# Patient Record
Sex: Female | Born: 1965 | Race: Black or African American | Hispanic: No | Marital: Single | Smoking: Never smoker
Health system: Southern US, Community
[De-identification: ages and names within clinical notes are randomized; demographics above are authoritative.]

## PROBLEM LIST (undated history)

## (undated) DIAGNOSIS — I10 Essential (primary) hypertension: Secondary | ICD-10-CM

## (undated) DIAGNOSIS — E119 Type 2 diabetes mellitus without complications: Secondary | ICD-10-CM

## (undated) HISTORY — DX: Essential (primary) hypertension: I10

---

## 2021-10-08 ENCOUNTER — Other Ambulatory Visit: Payer: Self-pay

## 2021-10-08 ENCOUNTER — Emergency Department (HOSPITAL_COMMUNITY): Payer: Medicaid Other

## 2021-10-08 ENCOUNTER — Emergency Department (HOSPITAL_COMMUNITY)
Admission: EM | Admit: 2021-10-08 | Discharge: 2021-10-08 | Disposition: A | Discharge status: Home / Self Care | Payer: Medicaid Other | Attending: Emergency Medicine | Admitting: Emergency Medicine

## 2021-10-08 DIAGNOSIS — R509 Fever, unspecified: Secondary | ICD-10-CM | POA: Diagnosis not present

## 2021-10-08 DIAGNOSIS — R0789 Other chest pain: Secondary | ICD-10-CM | POA: Insufficient documentation

## 2021-10-08 DIAGNOSIS — R739 Hyperglycemia, unspecified: Secondary | ICD-10-CM | POA: Diagnosis not present

## 2021-10-08 DIAGNOSIS — R630 Anorexia: Secondary | ICD-10-CM | POA: Insufficient documentation

## 2021-10-08 DIAGNOSIS — I10 Essential (primary) hypertension: Secondary | ICD-10-CM

## 2021-10-08 DIAGNOSIS — R1011 Right upper quadrant pain: Secondary | ICD-10-CM | POA: Diagnosis not present

## 2021-10-08 DIAGNOSIS — R519 Headache, unspecified: Secondary | ICD-10-CM

## 2021-10-08 DIAGNOSIS — D696 Thrombocytopenia, unspecified: Secondary | ICD-10-CM | POA: Diagnosis not present

## 2021-10-08 DIAGNOSIS — R1012 Left upper quadrant pain: Secondary | ICD-10-CM | POA: Diagnosis not present

## 2021-10-08 DIAGNOSIS — R079 Chest pain, unspecified: Secondary | ICD-10-CM | POA: Diagnosis present

## 2021-10-08 DIAGNOSIS — R1013 Epigastric pain: Secondary | ICD-10-CM | POA: Diagnosis not present

## 2021-10-08 DIAGNOSIS — Z20822 Contact with and (suspected) exposure to covid-19: Secondary | ICD-10-CM | POA: Diagnosis not present

## 2021-10-08 DIAGNOSIS — K8020 Calculus of gallbladder without cholecystitis without obstruction: Secondary | ICD-10-CM

## 2021-10-08 LAB — COMPREHENSIVE METABOLIC PANEL
ALT: 23 U/L (ref 0–44)
AST: 15 U/L (ref 15–41)
Albumin: 3.5 g/dL (ref 3.5–5.0)
Alkaline Phosphatase: 57 U/L (ref 38–126)
Anion gap: 8 (ref 5–15)
BUN: 9 mg/dL (ref 6–20)
CO2: 26 mmol/L (ref 22–32)
Calcium: 8.9 mg/dL (ref 8.9–10.3)
Chloride: 101 mmol/L (ref 98–111)
Creatinine, Ser: 0.65 mg/dL (ref 0.44–1.00)
GFR, Estimated: >60 mL/min (ref >60)
Glucose, Bld: 289 mg/dL — ABNORMAL HIGH (ref 70–99)
Potassium: 3.7 mmol/L (ref 3.5–5.1)
Sodium: 135 mmol/L (ref 135–145)
Total Bilirubin: 0.5 mg/dL (ref 0.3–1.2)
Total Protein: 7.2 g/dL (ref 6.5–8.1)

## 2021-10-08 LAB — URINALYSIS, ROUTINE W REFLEX MICROSCOPIC
Bacteria, UA: NONE SEEN
Bilirubin Urine: NEGATIVE
Glucose, UA: >=500 mg/dL — AB
Hgb urine dipstick: NEGATIVE
Ketones, ur: NEGATIVE mg/dL
Leukocytes,Ua: NEGATIVE
Nitrite: NEGATIVE
Protein, ur: NEGATIVE mg/dL
Specific Gravity, Urine: 1.012 (ref 1.005–1.030)
pH: 5 (ref 5.0–8.0)

## 2021-10-08 LAB — CBC WITH DIFFERENTIAL/PLATELET
Abs Immature Granulocytes: 0 10*3/uL (ref 0.00–0.07)
Basophils Absolute: 0 10*3/uL (ref 0.0–0.1)
Basophils Relative: 1 %
Eosinophils Absolute: 0.5 10*3/uL (ref 0.0–0.5)
Eosinophils Relative: 12 %
HCT: 40.1 % (ref 36.0–46.0)
Hemoglobin: 13.8 g/dL (ref 12.0–15.0)
Immature Granulocytes: 0 %
Lymphocytes Relative: 53 %
Lymphs Abs: 2.3 10*3/uL (ref 0.7–4.0)
MCH: 31.1 pg (ref 26.0–34.0)
MCHC: 34.4 g/dL (ref 30.0–36.0)
MCV: 90.3 fL (ref 80.0–100.0)
Monocytes Absolute: 0.2 10*3/uL (ref 0.1–1.0)
Monocytes Relative: 6 %
Neutro Abs: 1.2 10*3/uL — ABNORMAL LOW (ref 1.7–7.7)
Neutrophils Relative %: 28 %
Platelets: 56 10*3/uL — ABNORMAL LOW (ref 150–400)
RBC: 4.44 MIL/uL (ref 3.87–5.11)
RDW: 11.9 % (ref 11.5–15.5)
WBC: 4.3 10*3/uL (ref 4.0–10.5)
nRBC: 0 % (ref 0.0–0.2)

## 2021-10-08 LAB — TROPONIN I (HIGH SENSITIVITY)
Troponin I (High Sensitivity): 3 ng/L (ref <18)
Troponin I (High Sensitivity): 3 ng/L (ref <18)

## 2021-10-08 LAB — RESP PANEL BY RT-PCR (FLU A&B, COVID) ARPGX2
Influenza A by PCR: NEGATIVE
Influenza B by PCR: NEGATIVE
SARS Coronavirus 2 by RT PCR: NEGATIVE

## 2021-10-08 LAB — IMMATURE PLATELET FRACTION: Immature Platelet Fraction: 1.6 % (ref 1.2–8.6)

## 2021-10-08 LAB — BRAIN NATRIURETIC PEPTIDE: B Natriuretic Peptide: 11.4 pg/mL (ref 0.0–100.0)

## 2021-10-08 LAB — D-DIMER, QUANTITATIVE: D-Dimer, Quant: 0.59 ug/mL-FEU — ABNORMAL HIGH (ref 0.00–0.50)

## 2021-10-08 LAB — LIPASE, BLOOD: Lipase: 40 U/L (ref 11–51)

## 2021-10-08 MED ORDER — IOHEXOL 350 MG/ML SOLN
100.0000 mL | Freq: Once | INTRAVENOUS | Status: AC | PRN
  Administered 2021-10-08: 75 mL via INTRAVENOUS

## 2021-10-08 MED ORDER — METOCLOPRAMIDE HCL 5 MG/ML IJ SOLN
10.0000 mg | Freq: Once | INTRAMUSCULAR | Status: AC
  Administered 2021-10-08: 10 mg via INTRAVENOUS
  Filled 2021-10-08: qty 2

## 2021-10-08 MED ORDER — LACTATED RINGERS IV BOLUS
1000.0000 mL | Freq: Once | INTRAVENOUS | Status: AC
  Administered 2021-10-08: 1000 mL via INTRAVENOUS

## 2021-10-08 MED ORDER — ACETAMINOPHEN 325 MG PO TABS
650.0000 mg | ORAL_TABLET | Freq: Once | ORAL | Status: AC
  Administered 2021-10-08: 650 mg via ORAL
  Filled 2021-10-08: qty 2

## 2021-10-08 MED ORDER — AMLODIPINE BESYLATE 5 MG PO TABS
5.0000 mg | ORAL_TABLET | Freq: Once | ORAL | Status: AC
Start: 2021-10-08 — End: 2021-10-08
  Administered 2021-10-08: 5 mg via ORAL
  Filled 2021-10-08: qty 1

## 2021-10-08 MED ORDER — LOSARTAN POTASSIUM-HCTZ 100-25 MG PO TABS: 1.0000 | ORAL_TABLET | Freq: Every day | ORAL | 0 refills | Status: DC

## 2021-10-08 NOTE — Discharge Instructions (Addendum)
You can increase your blood pressure medication to two tablets daily.  The new prescription is a higher dose of your current medications to take when you run out of your current prescription.  Follow up with your primary care doctor to follow up on your blood pressure in the next week and your elevated blood sugar. ? ?Your blood tests also showed that your platelet count was low.  Follow up with the hematologist doctor to have that re checked.   ? ?Return to the ED for high fevers, bruising, bleeding or other concerning symptoms ?

## 2021-10-08 NOTE — ED Triage Notes (Signed)
Via EMS from PCP. Pt states CP, headache, loss of appetite x 1 week. Pt on BP meds. GCS 15. Pt reports midsternal cp that radiates "all over" BP at PCP systolic 190s.  ?

## 2021-10-08 NOTE — ED Provider Notes (Signed)
?[facility] ?Provider Note ? ? ?CSN: [id number] ?Arrival date & time: 10/08/21  1316 ? ?  ? ?History ? ?Chief Complaint  ?Patient presents with  ? Chest Pain  ? Migraine  ? ? ?Anna Stephens is a 56 y.o. female. ? ?Patient is a 56 year old female with no known medical problems who takes no prescription medications on a daily basis presenting today from PCPs office with complaints of headache, chest pain and loss of appetite since Monday.  Patient reports on Monday she started having a headache feeling generally bad all over and having chest pain which she describes in the front of her chest.  She reports the pain comes and goes but does not seem to be exacerbated by anything.  She cannot think of anything she can do to make the pain come on and it seems to go away on its own.  She has mild pain at the moment but denies any cough or shortness of breath.  She has had nausea but denies any vomiting or diarrhea.  She is also complaining of some upper abdominal pain.  The headache she reports is all over and feels that the headache was worse when she had a fever last night.  The fever was subjective but she reports she was very hot.  She has no known sick contacts.  She has had no recent travel.  No unilateral leg pain or swelling or prior history of clots.  She has no known heart disease but based on EMS the PCP reported she is supposed to be on blood pressure medication. ? ?The history is provided by the patient. The history is limited by a language barrier. A language interpreter was used Programmer, applications(Swahili).  ?Chest Pain ?Migraine ?Associated symptoms include chest pain.  ? ?  ? ?Home Medications ?Prior to Admission medications   ?Not on File  ?   ? ?Allergies    ?Patient has no known allergies.   ? ?Review of Systems   ?Review of Systems  ?Cardiovascular:  Positive for chest pain.  ? ?Physical Exam ?Updated Vital Signs ?BP (!) 155/96   Pulse 75   Temp 98.2 ?F (36.8 ?C)   Resp 18    SpO2 96%  ?Physical Exam ?Vitals and nursing note reviewed.  ?Constitutional:   ?   General: She is not in acute distress. ?   Appearance: She is well-developed.  ?HENT:  ?   Head: Normocephalic and atraumatic.  ?Eyes:  ?   Pupils: Pupils are equal, round, and reactive to light.  ?Cardiovascular:  ?   Rate and Rhythm: Normal rate and regular rhythm.  ?   Pulses: Normal pulses.  ?   Heart sounds: Normal heart sounds. No murmur heard. ?  No friction rub.  ?Pulmonary:  ?   Effort: Pulmonary effort is normal.  ?   Breath sounds: Normal breath sounds. No wheezing or rales.  ?   Comments: Chest is tender throughout palpation ?Chest:  ?   Chest wall: Tenderness present.  ?Abdominal:  ?   General: Bowel sounds are normal. There is no distension.  ?   Palpations: Abdomen is soft.  ?   Tenderness: There is abdominal tenderness in the right upper quadrant, epigastric area and left upper quadrant. There is no guarding or rebound.  ?Musculoskeletal:     ?   General: No tenderness. Normal range of motion.  ?   Cervical back: Normal range of motion and neck supple. No tenderness.  ?  Right lower leg: No edema.  ?   Left lower leg: No edema.  ?   Comments: No edema  ?Skin: ?   General: Skin is warm and dry.  ?   Findings: No rash.  ?Neurological:  ?   Mental Status: She is alert and oriented to person, place, and time. Mental status is at baseline.  ?   Cranial Nerves: No cranial nerve deficit.  ?Psychiatric:     ?   Mood and Affect: Mood normal.     ?   Behavior: Behavior normal.  ? ? ?ED Results / Procedures / Treatments   ?Labs ?(all labs ordered are listed, but only abnormal results are displayed) ?Labs Reviewed  ?CBC WITH DIFFERENTIAL/PLATELET - Abnormal; Notable for the following components:  ?    Result Value  ? Platelets 56 (*)   ? Neutro Abs 1.2 (*)   ? All other components within normal limits  ?COMPREHENSIVE METABOLIC PANEL - Abnormal; Notable for the following components:  ? Glucose, Bld 289 (*)   ? All other  components within normal limits  ?D-DIMER, QUANTITATIVE - Abnormal; Notable for the following components:  ? D-Dimer, Quant 0.59 (*)   ? All other components within normal limits  ?RESP PANEL BY RT-PCR (FLU A&B, COVID) [id number]  ?LIPASE, BLOOD  ?URINALYSIS, ROUTINE W REFLEX MICROSCOPIC  ?BRAIN NATRIURETIC PEPTIDE  ?TROPONIN I (HIGH SENSITIVITY)  ?TROPONIN I (HIGH SENSITIVITY)  ? ? ?EKG ?EKG Interpretation ? ?Date/Time:  Wednesday October 08 2021 13:24:18 EDT ?Ventricular Rate:  74 ?PR Interval:  189 ?QRS Duration: 90 ?QT Interval:  389 ?QTC Calculation: 432 ?R Axis:   86 ?Text Interpretation: Sinus rhythm Minimal ST elevation, lateral leads No previous tracing Confirmed by Gwyneth Sprout ([id number]) on 10/08/2021 2:03:36 PM ? ?Radiology ?DG Chest Port 1 View ? ?Result Date: 10/08/2021 ?CLINICAL DATA:  Chest pain EXAM: PORTABLE CHEST 1 VIEW COMPARISON:  None. FINDINGS: Mild bilateral interstitial thickening. No focal consolidation. No pleural effusion or pneumothorax. Stable cardiomegaly. No acute osseous abnormality. IMPRESSION: 1. Cardiomegaly with mild pulmonary vascular congestion. Electronically Signed   By: Elige Ko M.D.   On: 10/08/2021 14:12   ? ?Procedures ?Procedures  ? ? ?Medications Ordered in ED ?Medications  ?lactated ringers bolus 1,000 mL (0 mLs Intravenous Stopped 10/08/21 1438)  ?metoCLOPramide (REGLAN) injection 10 mg (10 mg Intravenous Given 10/08/21 1428)  ?acetaminophen (TYLENOL) tablet 650 mg (650 mg Oral Given 10/08/21 1428)  ? ? ?ED Course/ Medical Decision Making/ A&P ?  ?                        ?Medical Decision Making ?Amount and/or Complexity of Data Reviewed ?External Data Reviewed: notes. ?Labs: ordered. Decision-making details documented in ED Course. ?Radiology: ordered and independent interpretation performed. Decision-making details documented in ED Course. ?ECG/medicine tests: ordered and independent interpretation performed. Decision-making details documented in ED Course. ? ?Risk ?OTC  drugs. ?Prescription drug management. ? ? ?Patient is a 56 year old female with possible history of hypertension even though patient denies with nonspecific chest pain today which has been present intermittently since Monday but seems to be pleuritic in nature.  She denies any cough or vomiting but has had nausea.  Patient was seen by her PCP today and sent here for further care.  Patient also reports subjective fever last night.  Patient is well-appearing on exam but is complaining of a headache.  No findings suggestive of meningitis.  Patient has normal mental status.  No  findings to suggest stroke.  Concern for ACS versus pneumonia versus PE versus viral etiology with general malaise.  Patient given a headache cocktail, Tylenol and IV fluids.  We will continue to follow blood pressure here as it is elevated at 195/96.  I independently interpreted patient's EKG today and it is within normal limits today and lower suspicion at this time for hypertensive emergency.  Low risk wells criteria so d-dimer sent and HEART score of 2. ?I independently interpreted patient's labs today she has a negative COVID and flu, normal troponin, CMP with blood sugar of 289, otherwise normal CMP, normal CBC except for a platelet count of 56, D-dimer is mildly elevated at 0.59 lipase is within normal limits.  Patient will need a delta troponin and BNP is still pending.  I independently interpreted patient's chest x-ray it appears to be a poor expiratory film but radiology notes some cardiomegaly and possibly pulmonary congestion.  CTA is pending.  Patient checked out to Dr. Lynelle Doctor.  Blood pressure is improving after having headache cocktail. ? ? ? ? ? ? ? ? ? ?Final Clinical Impression(s) / ED Diagnoses ?Final diagnoses:  ?None  ? ? ?Rx / DC Orders ?ED Discharge Orders   ? ? None  ? ?  ? ? ?  ?Gwyneth Sprout, MD ?10/08/21 1544 ? ?

## 2021-10-08 NOTE — ED Notes (Signed)
Got patient undressed on the monitor did ekg shown to Dr Anitra Lauth patient is resting with family at bedside  ?

## 2021-10-08 NOTE — ED Notes (Signed)
Back from CT

## 2021-10-08 NOTE — ED Notes (Signed)
ED Provider at bedside. 

## 2021-10-08 NOTE — ED Provider Notes (Signed)
Patient was initially seen by Dr. Maryan Rued.  Please see her note. ? ?Clinical Course as of 10/09/21 1550  ?Wed Oct 08, 2021  ?1704 Troponin I (High Sensitivity) ?Serial troponins normal [JK]  ?1704 Lipase, blood ?Normal [JK]  ?1704 CBC with Differential/Platelet(!) ?Thrombocytopenia noted [JK]  ?1705 Urinalysis, Routine w reflex microscopic Urine, Clean Catch(!) ?Normal [JK]  ?1706 CT Angio Chest PE W and/or Wo Contrast ?Chest CT findings reviewed.  No PE.  Possible hepatic steatosis and gallstones [JK]  ?1729 Call placed to hematology oncology.   [JK]  ?1800 Discussed with Dr Lorenso Courier.  Hepatomegally noted on CT could be related.  Pt can follow up as an outpatient with heme onc [JK]  ?  ?Clinical Course User Index ?[JK] Dorie Rank, MD  ? ?Patient's labs are notable for thrombocytopenia.  Patient has evidence of hyperglycemia but no signs of acidosis.  Chest CT does not show any PE.  Patient does have incidental hepatic steatosis and gallstones but her symptoms are not suggestive of biliary colic or cholecystitis.  No signs of acute cardiac ischemia.  No signs of CHF. ? ?Since blood pressure does remain elevated.  She takes losartan 50 mg/HCTZ.  We will have the patient increase her dosing. ? ?Thrombocytopenia also noted.  Unclear etiology although no findings to suggest TTP.  Will have pt follow up with hematology oncology ?  ?Dorie Rank, MD ?10/09/21 1550 ? ?

## 2021-10-08 NOTE — ED Notes (Signed)
Patient transported to CT 

## 2021-10-09 ENCOUNTER — Other Ambulatory Visit: Payer: Self-pay | Admitting: Physician Assistant

## 2021-10-09 DIAGNOSIS — Z1231 Encounter for screening mammogram for malignant neoplasm of breast: Secondary | ICD-10-CM

## 2021-10-10 ENCOUNTER — Telehealth: Payer: Self-pay | Admitting: Hematology and Oncology

## 2021-10-10 NOTE — Telephone Encounter (Signed)
Scheduled appt per 4/27 referral. Used interpreter services. Pt is aware of appt date and time. Pt is aware to arrive 15 mins prior to appt time and to bring and updated insurance card. Pt is aware of appt location.   ?

## 2021-10-16 ENCOUNTER — Inpatient Hospital Stay: Payer: Medicaid Other | Attending: Hematology and Oncology | Admitting: Hematology and Oncology

## 2021-10-16 ENCOUNTER — Inpatient Hospital Stay: Payer: Medicaid Other

## 2022-01-16 ENCOUNTER — Encounter (HOSPITAL_COMMUNITY): Payer: Self-pay | Admitting: *Deleted

## 2022-01-16 ENCOUNTER — Other Ambulatory Visit: Payer: Self-pay

## 2022-01-16 ENCOUNTER — Ambulatory Visit (HOSPITAL_COMMUNITY)
Admission: EM | Admit: 2022-01-16 | Discharge: 2022-01-16 | Disposition: A | Discharge status: Home / Self Care | Payer: Medicaid Other | Attending: Urgent Care | Admitting: Urgent Care

## 2022-01-16 DIAGNOSIS — R7309 Other abnormal glucose: Secondary | ICD-10-CM | POA: Diagnosis present

## 2022-01-16 DIAGNOSIS — D691 Qualitative platelet defects: Secondary | ICD-10-CM | POA: Diagnosis not present

## 2022-01-16 DIAGNOSIS — I10 Essential (primary) hypertension: Secondary | ICD-10-CM | POA: Diagnosis not present

## 2022-01-16 DIAGNOSIS — Z760 Encounter for issue of repeat prescription: Secondary | ICD-10-CM | POA: Diagnosis present

## 2022-01-16 DIAGNOSIS — E1165 Type 2 diabetes mellitus with hyperglycemia: Secondary | ICD-10-CM | POA: Diagnosis not present

## 2022-01-16 LAB — CBC WITH DIFFERENTIAL/PLATELET
Abs Immature Granulocytes: 0.01 10*3/uL (ref 0.00–0.07)
Basophils Absolute: 0.1 10*3/uL (ref 0.0–0.1)
Basophils Relative: 1 %
Eosinophils Absolute: 1.4 10*3/uL — ABNORMAL HIGH (ref 0.0–0.5)
Eosinophils Relative: 25 %
HCT: 42.4 % (ref 36.0–46.0)
Hemoglobin: 14.8 g/dL (ref 12.0–15.0)
Immature Granulocytes: 0 %
Lymphocytes Relative: 38 %
Lymphs Abs: 2 10*3/uL (ref 0.7–4.0)
MCH: 30.3 pg (ref 26.0–34.0)
MCHC: 34.9 g/dL (ref 30.0–36.0)
MCV: 86.7 fL (ref 80.0–100.0)
Monocytes Absolute: 0.2 10*3/uL (ref 0.1–1.0)
Monocytes Relative: 5 %
Neutro Abs: 1.7 10*3/uL (ref 1.7–7.7)
Neutrophils Relative %: 31 %
Platelets: 215 10*3/uL (ref 150–400)
RBC: 4.89 MIL/uL (ref 3.87–5.11)
RDW: 12.9 % (ref 11.5–15.5)
WBC: 5.4 10*3/uL (ref 4.0–10.5)
nRBC: 0 % (ref 0.0–0.2)

## 2022-01-16 LAB — CBG MONITORING, ED: Glucose-Capillary: 497 mg/dL — ABNORMAL HIGH (ref 70–99)

## 2022-01-16 MED ORDER — METFORMIN HCL 500 MG PO TABS: 500.0000 mg | ORAL_TABLET | Freq: Two times a day (BID) | ORAL | 0 refills | Status: DC

## 2022-01-16 MED ORDER — LOSARTAN POTASSIUM-HCTZ 100-25 MG PO TABS: 1.0000 | ORAL_TABLET | Freq: Every day | ORAL | 0 refills | Status: DC

## 2022-01-16 NOTE — ED Provider Notes (Signed)
[facility]    CSN: [id number] Arrival date & time: 01/16/22  1234      History   Chief Complaint Chief Complaint  Patient presents with   Medication Refill    HPI Anna Stephens is a 56 y.o. female.   Pleasant 56 year old female presents today primarily requesting blood pressure medication refill.  She was seen in the emergency room back in April due to a headache, had an extensive work-up, and was discharged home on losartan hydrochlorothiazide.  Patient states she has been taking it as prescribed and feels that the blood pressure medication is working.  Normalization of her blood pressure did stop her headaches.  She denies any side effects to the medication.  She unfortunately ran out of bed ago, and has not been on recently.  Of note, her glucose was close to 300 and platelets were 56 in the emergency room.  She has not yet had them rechecked.  She denies easy bruising, abnormal bleeding, or abdominal pain.  She denies blurred vision or dizziness.  She denies polyuria.  She has no acute complaints today.  She does not have a PCP.   Medication Refill   History reviewed. No pertinent past medical history.  There are no problems to display for this patient.   History reviewed. No pertinent surgical history.  OB History   No obstetric history on file.      Home Medications    Prior to Admission medications   Medication Sig Start Date End Date Taking? Authorizing Provider  losartan-hydrochlorothiazide (HYZAAR) 100-25 MG tablet Take 1 tablet by mouth daily. 01/16/22   Maretta Bees, PA    Family History History reviewed. No pertinent family history.  Social History Social History   Tobacco Use   Smoking status: Never   Smokeless tobacco: Never     Allergies   Patient has no known allergies.   Review of Systems Review of Systems  All other systems reviewed and are negative.    Physical Exam Triage Vital Signs ED Triage Vitals  Enc Vitals  Group     BP 01/16/22 1309 (!) 141/86     Pulse Rate 01/16/22 1309 62     Resp 01/16/22 1309 18     Temp 01/16/22 1309 98.6 F (37 C)     Temp src --      SpO2 01/16/22 1309 98 %     Weight --      Height --      Head Circumference --      Peak Flow --      Pain Score 01/16/22 1307 0     Pain Loc --      Pain Edu? --      Excl. in GC? --    No data found.  Updated Vital Signs BP (!) 141/86   Pulse 62   Temp 98.6 F (37 C)   Resp 18   SpO2 98%   Visual Acuity Right Eye Distance:   Left Eye Distance:   Bilateral Distance:    Right Eye Near:   Left Eye Near:    Bilateral Near:     Physical Exam Vitals and nursing note reviewed.  Constitutional:      General: She is not in acute distress.    Appearance: Normal appearance. She is obese. She is not ill-appearing, toxic-appearing or diaphoretic.  HENT:     Head: Normocephalic and atraumatic.     Right Ear: External ear normal.  Left Ear: External ear normal.  Cardiovascular:     Rate and Rhythm: Normal rate and regular rhythm.     Pulses: Normal pulses.  Pulmonary:     Effort: Pulmonary effort is normal. No respiratory distress.     Breath sounds: Normal breath sounds. No stridor.  Musculoskeletal:        General: Normal range of motion.     Cervical back: Normal range of motion.  Skin:    General: Skin is warm.     Findings: No erythema or rash.  Neurological:     Mental Status: She is alert and oriented to person, place, and time.      UC Treatments / Results  Labs (all labs ordered are listed, but only abnormal results are displayed) Labs Reviewed  CBG MONITORING, ED - Abnormal; Notable for the following components:      Result Value   Glucose-Capillary 497 (*)    All other components within normal limits  CBC WITH DIFFERENTIAL/PLATELET    EKG   Radiology No results found.  Procedures Procedures (including critical care time)  Medications Ordered in UC Medications - No data to  display  Initial Impression / Assessment and Plan / UC Course  I have reviewed the triage vital signs and the nursing notes.  Pertinent labs & imaging results that were available during my care of the patient were reviewed by me and considered in my medical decision making (see chart for details).     Medication refill/ HTN - BP appears slightly elevated today, however ran out of medication a few weeks ago. Per pt, BP was normal while on medications. Tolerating well.  Diabetes mellitus - glucose was nearly 300 in April in ER, nearly 500 here today. Pt without sx of DKA or HHS, I suspect this to be her baseline. She denies headache, fatigue, polyuria, polydipsia. Will discharge her home on medication for DM (last creatinine 0.65) and schedule pt for follow up with PCP on 01/22/22 Abnormal platelets - 56 in ER. Recheck today. Pt does not show evidence of bruising or bleeding.  Final Clinical Impressions(s) / UC Diagnoses   Final diagnoses:  Medication refill  Primary hypertension  Abnormal platelets (HCC)  Other abnormal glucose     Discharge Instructions      Your glucose is almost 500. You have Diabetes mellitus. Please start taking medication as prescribed today for your blood sugar. You will need to follow up with a PCP in 2 weeks for recheck. I have also refilled your blood pressure medication. We will call with the results of your labs as indicated.      ED Prescriptions     Medication Sig Dispense Auth. Provider   losartan-hydrochlorothiazide (HYZAAR) 100-25 MG tablet Take 1 tablet by mouth daily. 30 tablet Laberta Wilbon L, [place]      PDMP not reviewed this encounter.   Maretta Bees, [place] 01/16/22 1404

## 2022-01-16 NOTE — ED Triage Notes (Addendum)
PT reports she needs a refill on BP med but not know the name of med. Last does 3 weeks ago.

## 2022-01-16 NOTE — Discharge Instructions (Addendum)
Your glucose is almost 500. You have Diabetes mellitus. Please start taking medication as prescribed today for your blood sugar. Take twice daily with food. You will need to follow up with a PCP with in 1 week for recheck. We made you an appointment with Glenetta Hew on 01/22/22 for a new patient evaluation. I have also refilled your blood pressure medication. Take this one pill every morning. We will call with the results of your labs as indicated.

## 2022-01-22 ENCOUNTER — Ambulatory Visit: Payer: Medicaid Other | Admitting: Internal Medicine

## 2022-01-28 ENCOUNTER — Ambulatory Visit (INDEPENDENT_AMBULATORY_CARE_PROVIDER_SITE_OTHER): Payer: Medicaid Other | Admitting: Nurse Practitioner

## 2022-01-28 ENCOUNTER — Encounter: Payer: Self-pay | Admitting: Nurse Practitioner

## 2022-01-28 VITALS — BP 164/111 | HR 84 | Temp 97.3°F | Ht 60.0 in | Wt 217.8 lb

## 2022-01-28 DIAGNOSIS — Z0000 Encounter for general adult medical examination without abnormal findings: Secondary | ICD-10-CM

## 2022-01-28 DIAGNOSIS — E119 Type 2 diabetes mellitus without complications: Secondary | ICD-10-CM | POA: Diagnosis not present

## 2022-01-28 LAB — POCT URINALYSIS DIP (CLINITEK)
Bilirubin, UA: NEGATIVE
Blood, UA: NEGATIVE
Glucose, UA: 500 mg/dL — AB
Ketones, POC UA: NEGATIVE mg/dL
Leukocytes, UA: NEGATIVE
Nitrite, UA: NEGATIVE
POC PROTEIN,UA: NEGATIVE
Spec Grav, UA: <=1.005 — AB (ref 1.010–1.025)
Urobilinogen, UA: 0.2 U/dL
pH, UA: 6.5 (ref 5.0–8.0)

## 2022-01-28 MED ORDER — METFORMIN HCL 500 MG PO TABS: 500.0000 mg | ORAL_TABLET | Freq: Two times a day (BID) | ORAL | 0 refills | Status: DC

## 2022-01-28 MED ORDER — LOSARTAN POTASSIUM-HCTZ 100-25 MG PO TABS: 1.0000 | ORAL_TABLET | Freq: Every day | ORAL | 0 refills | Status: DC

## 2022-01-28 NOTE — Patient Instructions (Signed)
1. Health care maintenance  - POCT glycosylated hemoglobin (Hb A1C) - POCT URINALYSIS DIP (CLINITEK) - losartan-hydrochlorothiazide (HYZAAR) 100-25 MG tablet; Take 1 tablet by mouth daily.  Dispense: 30 tablet; Refill: 0 - metFORMIN (GLUCOPHAGE) 500 MG tablet; Take 1 tablet (500 mg total) by mouth 2 (two) times daily with a meal.  Dispense: 30 tablet; Refill: 0 - CBC - Comprehensive metabolic panel - Hemoglobin A1c  2. Type 2 diabetes mellitus without complication, without long-term current use of insulin (HCC)  - Hemoglobin A1c   Follow up:  Follow up in 3 months or sooner if needed

## 2022-01-28 NOTE — Progress Notes (Signed)
@Patient  ID: , female    DOB: [birth year], 56 y.o.   MRN: [id number]  Chief Complaint  Patient presents with   Establish Care    Pt is here establish care.    Referring provider: No ref. provider found   HPI  56 year old with history of hypertension and diabetes    Patient is not taking medications. She was prescribed diabetic medication and hypertension meds through the ED. She never picked up these medications from the pharmacy. We will send these medications back to the pharmacy again today. Blood pressure is elevated in office. She does have case worker and interpreter with her toady. Denies f/c/s, n/v/d, hemoptysis, PND, leg swelling Denies chest pain or edema    No Known Allergies   There is no immunization history on file for this patient.  Past Medical History:  Diagnosis Date   Hypertension     Tobacco History: Social History   Tobacco Use  Smoking Status Never  Smokeless Tobacco Never   Counseling given: Not Answered   Outpatient Encounter Medications as of 01/28/2022  Medication Sig   losartan-hydrochlorothiazide (HYZAAR) 100-25 MG tablet Take 1 tablet by mouth daily.   metFORMIN (GLUCOPHAGE) 500 MG tablet Take 1 tablet (500 mg total) by mouth 2 (two) times daily with a meal.   [DISCONTINUED] losartan-hydrochlorothiazide (HYZAAR) 100-25 MG tablet Take 1 tablet by mouth daily. (Patient not taking: Reported on 01/28/2022)   [DISCONTINUED] metFORMIN (GLUCOPHAGE) 500 MG tablet Take 1 tablet (500 mg total) by mouth 2 (two) times daily with a meal. (Patient not taking: Reported on 01/28/2022)   No facility-administered encounter medications on file as of 01/28/2022.     Review of Systems  Review of Systems  Constitutional: Negative.   HENT: Negative.    Cardiovascular: Negative.   Gastrointestinal: Negative.   Allergic/Immunologic: Negative.   Neurological: Negative.   Psychiatric/Behavioral: Negative.         Physical Exam  BP (!)  164/111 (BP Location: Right Arm, Patient Position: Sitting, Cuff Size: Large)   Pulse 84   Temp (!) 97.3 F (36.3 C)   Ht 5' (1.524 m)   Wt 217 lb 12.8 oz (98.8 kg)   SpO2 98%   BMI 42.54 kg/m   Wt Readings from Last 5 Encounters:  01/28/22 217 lb 12.8 oz (98.8 kg)     Physical Exam Vitals and nursing note reviewed.  Constitutional:      General: She is not in acute distress.    Appearance: She is well-developed.  Cardiovascular:     Rate and Rhythm: Normal rate and regular rhythm.  Pulmonary:     Effort: Pulmonary effort is normal.     Breath sounds: Normal breath sounds.  Neurological:     Mental Status: She is alert and oriented to person, place, and time.      Lab Results:  CBC    Component Value Date/Time   WBC 5.4 01/16/2022 1336   RBC 4.89 01/16/2022 1336   HGB 14.8 01/16/2022 1336   HCT 42.4 01/16/2022 1336   PLT 215 01/16/2022 1336   MCV 86.7 01/16/2022 1336   MCH 30.3 01/16/2022 1336   MCHC 34.9 01/16/2022 1336   RDW 12.9 01/16/2022 1336   LYMPHSABS 2.0 01/16/2022 1336   MONOABS 0.2 01/16/2022 1336   EOSABS 1.4 (H) 01/16/2022 1336   BASOSABS 0.1 01/16/2022 1336    BMET    Component Value Date/Time   NA 135 10/08/2021 1357   K 3.7 10/08/2021 1357  CL 101 10/08/2021 1357   CO2 26 10/08/2021 1357   GLUCOSE 289 (H) 10/08/2021 1357   BUN 9 10/08/2021 1357   CREATININE 0.65 10/08/2021 1357   CALCIUM 8.9 10/08/2021 1357   GFRNONAA >60 10/08/2021 1357    BNP    Component Value Date/Time   BNP 11.4 10/08/2021 1357      Assessment & Plan:   Health care maintenance - POCT glycosylated hemoglobin (Hb A1C) - POCT URINALYSIS DIP (CLINITEK) - losartan-hydrochlorothiazide (HYZAAR) 100-25 MG tablet; Take 1 tablet by mouth daily.  Dispense: 30 tablet; Refill: 0 - metFORMIN (GLUCOPHAGE) 500 MG tablet; Take 1 tablet (500 mg total) by mouth 2 (two) times daily with a meal.  Dispense: 30 tablet; Refill: 0 - CBC - Comprehensive metabolic panel -  Hemoglobin A1c  2. Type 2 diabetes mellitus without complication, without long-term current use of insulin (HCC)  - Hemoglobin A1c   Follow up:  Follow up in 3 months or sooner if needed     Ivonne Andrew, NP 01/28/2022

## 2022-01-28 NOTE — Assessment & Plan Note (Signed)
-   POCT glycosylated hemoglobin (Hb A1C) - POCT URINALYSIS DIP (CLINITEK) - losartan-hydrochlorothiazide (HYZAAR) 100-25 MG tablet; Take 1 tablet by mouth daily.  Dispense: 30 tablet; Refill: 0 - metFORMIN (GLUCOPHAGE) 500 MG tablet; Take 1 tablet (500 mg total) by mouth 2 (two) times daily with a meal.  Dispense: 30 tablet; Refill: 0 - CBC - Comprehensive metabolic panel - Hemoglobin A1c  2. Type 2 diabetes mellitus without complication, without long-term current use of insulin (HCC)  - Hemoglobin A1c   Follow up:  Follow up in 3 months or sooner if needed

## 2022-01-29 ENCOUNTER — Telehealth: Payer: Self-pay | Admitting: Nurse Practitioner

## 2022-01-29 NOTE — Telephone Encounter (Signed)
Patient notified that Blood glucose was critical high and that she needs to go to the ED for further evaluation. Patient agrees. Family will transport.

## 2022-01-30 ENCOUNTER — Telehealth: Payer: Self-pay

## 2022-01-30 ENCOUNTER — Telehealth: Payer: Self-pay | Admitting: Nurse Practitioner

## 2022-01-30 ENCOUNTER — Inpatient Hospital Stay (HOSPITAL_COMMUNITY)
Admission: EM | Admit: 2022-01-30 | Discharge: 2022-02-02 | DRG: 638 | Disposition: A | Discharge status: Home / Self Care | Payer: Medicaid Other | Attending: Internal Medicine | Admitting: Internal Medicine

## 2022-01-30 ENCOUNTER — Encounter (HOSPITAL_COMMUNITY): Payer: Self-pay

## 2022-01-30 DIAGNOSIS — Z23 Encounter for immunization: Secondary | ICD-10-CM | POA: Diagnosis not present

## 2022-01-30 DIAGNOSIS — E860 Dehydration: Secondary | ICD-10-CM | POA: Diagnosis present

## 2022-01-30 DIAGNOSIS — Z79899 Other long term (current) drug therapy: Secondary | ICD-10-CM

## 2022-01-30 DIAGNOSIS — E1165 Type 2 diabetes mellitus with hyperglycemia: Secondary | ICD-10-CM | POA: Diagnosis present

## 2022-01-30 DIAGNOSIS — Z6841 Body Mass Index (BMI) 40.0 and over, adult: Secondary | ICD-10-CM | POA: Diagnosis not present

## 2022-01-30 DIAGNOSIS — E119 Type 2 diabetes mellitus without complications: Secondary | ICD-10-CM

## 2022-01-30 DIAGNOSIS — R739 Hyperglycemia, unspecified: Principal | ICD-10-CM

## 2022-01-30 DIAGNOSIS — Z603 Acculturation difficulty: Secondary | ICD-10-CM | POA: Diagnosis present

## 2022-01-30 DIAGNOSIS — E1100 Type 2 diabetes mellitus with hyperosmolarity without nonketotic hyperglycemic-hyperosmolar coma (NKHHC): Secondary | ICD-10-CM | POA: Diagnosis present

## 2022-01-30 DIAGNOSIS — I10 Essential (primary) hypertension: Secondary | ICD-10-CM | POA: Diagnosis present

## 2022-01-30 DIAGNOSIS — E876 Hypokalemia: Secondary | ICD-10-CM | POA: Diagnosis present

## 2022-01-30 LAB — BASIC METABOLIC PANEL
Anion gap: 12 (ref 5–15)
Anion gap: 9 (ref 5–15)
Anion gap: 9 (ref 5–15)
BUN: 15 mg/dL (ref 6–20)
BUN: 14 mg/dL (ref 6–20)
BUN: 10 mg/dL (ref 6–20)
CO2: 26 mmol/L (ref 22–32)
CO2: 28 mmol/L (ref 22–32)
CO2: 30 mmol/L (ref 22–32)
Calcium: 9.5 mg/dL (ref 8.9–10.3)
Calcium: 9.2 mg/dL (ref 8.9–10.3)
Calcium: 9.1 mg/dL (ref 8.9–10.3)
Chloride: 88 mmol/L — ABNORMAL LOW (ref 98–111)
Chloride: 96 mmol/L — ABNORMAL LOW (ref 98–111)
Chloride: 103 mmol/L (ref 98–111)
Creatinine, Ser: 0.91 mg/dL (ref 0.44–1.00)
Creatinine, Ser: 0.7 mg/dL (ref 0.44–1.00)
Creatinine, Ser: 0.62 mg/dL (ref 0.44–1.00)
GFR, Estimated: >60 mL/min (ref >60)
GFR, Estimated: >60 mL/min (ref >60)
GFR, Estimated: >60 mL/min (ref >60)
Glucose, Bld: 854 mg/dL (ref 70–99)
Glucose, Bld: 577 mg/dL (ref 70–99)
Glucose, Bld: 179 mg/dL — ABNORMAL HIGH (ref 70–99)
Potassium: 3.3 mmol/L — ABNORMAL LOW (ref 3.5–5.1)
Potassium: 3.4 mmol/L — ABNORMAL LOW (ref 3.5–5.1)
Potassium: 3 mmol/L — ABNORMAL LOW (ref 3.5–5.1)
Sodium: 126 mmol/L — ABNORMAL LOW (ref 135–145)
Sodium: 133 mmol/L — ABNORMAL LOW (ref 135–145)
Sodium: 142 mmol/L (ref 135–145)

## 2022-01-30 LAB — COMPREHENSIVE METABOLIC PANEL
ALT: 19 IU/L (ref 0–32)
AST: 15 IU/L (ref 0–40)
Albumin/Globulin Ratio: 1.2 (ref 1.2–2.2)
Albumin: 4.3 g/dL (ref 3.8–4.9)
Alkaline Phosphatase: 101 IU/L (ref 44–121)
BUN/Creatinine Ratio: 8 — ABNORMAL LOW (ref 9–23)
BUN: 7 mg/dL (ref 6–24)
Bilirubin Total: 0.5 mg/dL (ref 0.0–1.2)
CO2: 22 mmol/L (ref 20–29)
Calcium: 9.3 mg/dL (ref 8.7–10.2)
Chloride: 95 mmol/L — ABNORMAL LOW (ref 96–106)
Creatinine, Ser: 0.83 mg/dL (ref 0.57–1.00)
Globulin, Total: 3.6 g/dL (ref 1.5–4.5)
Glucose: 584 mg/dL (ref 70–99)
Potassium: 3.8 mmol/L (ref 3.5–5.2)
Sodium: 136 mmol/L (ref 134–144)
Total Protein: 7.9 g/dL (ref 6.0–8.5)
eGFR: 83 mL/min/{1.73_m2} (ref >59)

## 2022-01-30 LAB — CBC WITH DIFFERENTIAL/PLATELET
Abs Immature Granulocytes: 0 10*3/uL (ref 0.00–0.07)
Basophils Absolute: 0 10*3/uL (ref 0.0–0.1)
Basophils Relative: 1 %
Eosinophils Absolute: 1.4 10*3/uL — ABNORMAL HIGH (ref 0.0–0.5)
Eosinophils Relative: 25 %
HCT: 46.3 % — ABNORMAL HIGH (ref 36.0–46.0)
Hemoglobin: 15.8 g/dL — ABNORMAL HIGH (ref 12.0–15.0)
Immature Granulocytes: 0 %
Lymphocytes Relative: 32 %
Lymphs Abs: 1.8 10*3/uL (ref 0.7–4.0)
MCH: 30.4 pg (ref 26.0–34.0)
MCHC: 34.1 g/dL (ref 30.0–36.0)
MCV: 89.2 fL (ref 80.0–100.0)
Monocytes Absolute: 0.3 10*3/uL (ref 0.1–1.0)
Monocytes Relative: 4 %
Neutro Abs: 2.1 10*3/uL (ref 1.7–7.7)
Neutrophils Relative %: 38 %
Platelets: 198 10*3/uL (ref 150–400)
RBC: 5.19 MIL/uL — ABNORMAL HIGH (ref 3.87–5.11)
RDW: 12.7 % (ref 11.5–15.5)
WBC: 5.6 10*3/uL (ref 4.0–10.5)
nRBC: 0 % (ref 0.0–0.2)

## 2022-01-30 LAB — CBC
Hematocrit: 46.5 % (ref 34.0–46.6)
Hemoglobin: 15.4 g/dL (ref 11.1–15.9)
MCH: 30 pg (ref 26.6–33.0)
MCHC: 33.1 g/dL (ref 31.5–35.7)
MCV: 91 fL (ref 79–97)
Platelets: 212 10*3/uL (ref 150–450)
RBC: 5.14 x10E6/uL (ref 3.77–5.28)
RDW: 13.3 % (ref 11.7–15.4)
WBC: 6.3 10*3/uL (ref 3.4–10.8)

## 2022-01-30 LAB — CBG MONITORING, ED
Glucose-Capillary: 166 mg/dL — ABNORMAL HIGH (ref 70–99)
Glucose-Capillary: 191 mg/dL — ABNORMAL HIGH (ref 70–99)
Glucose-Capillary: 197 mg/dL — ABNORMAL HIGH (ref 70–99)
Glucose-Capillary: 222 mg/dL — ABNORMAL HIGH (ref 70–99)
Glucose-Capillary: 239 mg/dL — ABNORMAL HIGH (ref 70–99)
Glucose-Capillary: 350 mg/dL — ABNORMAL HIGH (ref 70–99)
Glucose-Capillary: 438 mg/dL — ABNORMAL HIGH (ref 70–99)
Glucose-Capillary: 565 mg/dL (ref 70–99)
Glucose-Capillary: >600 mg/dL (ref 70–99)

## 2022-01-30 LAB — URINALYSIS, ROUTINE W REFLEX MICROSCOPIC
Bacteria, UA: NONE SEEN
Bilirubin Urine: NEGATIVE
Glucose, UA: >=500 mg/dL — AB
Hgb urine dipstick: NEGATIVE
Ketones, ur: NEGATIVE mg/dL
Leukocytes,Ua: NEGATIVE
Nitrite: NEGATIVE
Protein, ur: NEGATIVE mg/dL
Specific Gravity, Urine: 1.023 (ref 1.005–1.030)
pH: 6 (ref 5.0–8.0)

## 2022-01-30 LAB — BLOOD GAS, VENOUS
Acid-Base Excess: 8 mmol/L — ABNORMAL HIGH (ref 0.0–2.0)
Bicarbonate: 33.9 mmol/L — ABNORMAL HIGH (ref 20.0–28.0)
O2 Saturation: 49.8 %
Patient temperature: 37
pCO2, Ven: 51 mmHg (ref 44–60)
pH, Ven: 7.43 (ref 7.25–7.43)
pO2, Ven: 33 mmHg (ref 32–45)

## 2022-01-30 LAB — HEMOGLOBIN A1C
Est. average glucose Bld gHb Est-mCnc: >398 mg/dL
Hgb A1c MFr Bld: >15.5 % — ABNORMAL HIGH (ref 4.8–5.6)

## 2022-01-30 LAB — MAGNESIUM: Magnesium: 2 mg/dL (ref 1.7–2.4)

## 2022-01-30 LAB — BETA-HYDROXYBUTYRIC ACID: Beta-Hydroxybutyric Acid: 0.19 mmol/L (ref 0.05–0.27)

## 2022-01-30 MED ORDER — INSULIN ASPART 100 UNIT/ML IJ SOLN
0.0000 [IU] | Freq: Three times a day (TID) | INTRAMUSCULAR | Status: DC
  Administered 2022-01-31: 7 [IU] via SUBCUTANEOUS
  Administered 2022-01-31: 5 [IU] via SUBCUTANEOUS
  Administered 2022-01-31: 3 [IU] via SUBCUTANEOUS
  Administered 2022-02-01: 5 [IU] via SUBCUTANEOUS
  Filled 2022-01-30: qty 0.09

## 2022-01-30 MED ORDER — INSULIN ASPART 100 UNIT/ML IJ SOLN
3.0000 [IU] | Freq: Three times a day (TID) | INTRAMUSCULAR | Status: DC
  Filled 2022-01-30: qty 0.03

## 2022-01-30 MED ORDER — LACTATED RINGERS IV SOLN: INTRAVENOUS | Status: DC

## 2022-01-30 MED ORDER — INSULIN REGULAR(HUMAN) IN NACL 100-0.9 UT/100ML-% IV SOLN
INTRAVENOUS | Status: DC
Start: 2022-01-30 — End: 2022-01-31
  Administered 2022-01-30: 8 [IU]/h via INTRAVENOUS
  Filled 2022-01-30: qty 100

## 2022-01-30 MED ORDER — LACTATED RINGERS IV BOLUS: 20.0000 mL/kg | Freq: Once | INTRAVENOUS | Status: DC

## 2022-01-30 MED ORDER — DEXTROSE 50 % IV SOLN: 0.0000 mL | INTRAVENOUS | Status: DC | PRN

## 2022-01-30 MED ORDER — POTASSIUM CHLORIDE 10 MEQ/100ML IV SOLN
10.0000 meq | INTRAVENOUS | Status: AC
  Administered 2022-01-30 (×4): 10 meq via INTRAVENOUS
  Filled 2022-01-30 (×4): qty 100

## 2022-01-30 MED ORDER — INSULIN ASPART 100 UNIT/ML IJ SOLN
0.0000 [IU] | Freq: Every day | INTRAMUSCULAR | Status: DC
  Administered 2022-01-30: 2 [IU] via SUBCUTANEOUS
  Filled 2022-01-30: qty 0.05

## 2022-01-30 MED ORDER — DEXTROSE IN LACTATED RINGERS 5 % IV SOLN: INTRAVENOUS | Status: DC

## 2022-01-30 MED ORDER — SODIUM CHLORIDE 0.9 % IV BOLUS
1000.0000 mL | Freq: Once | INTRAVENOUS | Status: AC
Start: 2022-01-30 — End: 2022-01-30
  Administered 2022-01-30: 1000 mL via INTRAVENOUS

## 2022-01-30 MED ORDER — INSULIN GLARGINE-YFGN 100 UNIT/ML ~~LOC~~ SOLN
10.0000 [IU] | Freq: Every day | SUBCUTANEOUS | Status: DC
  Administered 2022-01-30: 10 [IU] via SUBCUTANEOUS
  Filled 2022-01-30 (×2): qty 0.1

## 2022-01-30 NOTE — Telephone Encounter (Signed)
error 

## 2022-01-30 NOTE — Telephone Encounter (Signed)
I notified patient per provider request to find out why patient didn't go to the ED patient stated she didn't have transportation. Transportation was set up this morning for patient to go to the emergency room today. I expressed to patient the importance of going to the ED patient stated she will go with taxi when it arrives  

## 2022-01-30 NOTE — Telephone Encounter (Signed)
I notified patient per provider request to find out why patient didn't go to the ED patient stated she didn't have transportation. Transportation was set up this morning for patient to go to the emergency room today. I expressed to patient the importance of going to the ED patient stated she will go with taxi when it arrives

## 2022-01-30 NOTE — Telephone Encounter (Signed)
Please call and ask patient why she didn't go to the ED as advised. She will need to come to the office for lab visit. Please make sure she has started back on her diabetic medications. Thanks.

## 2022-01-30 NOTE — Telephone Encounter (Signed)
Spoke with patient via interpreter. Confirmed pick up address for taxi pick up.   Spoke with [employer], filled out voucher, and faxed voucher to them.   [employer] should be picking her up around 11:15 this morning from [address] and taking her to [place]

## 2022-01-30 NOTE — Telephone Encounter (Signed)
Thanks

## 2022-01-31 ENCOUNTER — Encounter (HOSPITAL_COMMUNITY): Payer: Self-pay | Admitting: Internal Medicine

## 2022-01-31 ENCOUNTER — Other Ambulatory Visit: Payer: Self-pay

## 2022-01-31 LAB — GLUCOSE, CAPILLARY
Glucose-Capillary: 290 mg/dL — ABNORMAL HIGH (ref 70–99)
Glucose-Capillary: 212 mg/dL — ABNORMAL HIGH (ref 70–99)
Glucose-Capillary: 153 mg/dL — ABNORMAL HIGH (ref 70–99)

## 2022-01-31 LAB — BASIC METABOLIC PANEL
Anion gap: 8 (ref 5–15)
BUN: 10 mg/dL (ref 6–20)
CO2: 28 mmol/L (ref 22–32)
Calcium: 8.9 mg/dL (ref 8.9–10.3)
Chloride: 102 mmol/L (ref 98–111)
Creatinine, Ser: 0.49 mg/dL (ref 0.44–1.00)
GFR, Estimated: >60 mL/min (ref >60)
Glucose, Bld: 186 mg/dL — ABNORMAL HIGH (ref 70–99)
Potassium: 3.1 mmol/L — ABNORMAL LOW (ref 3.5–5.1)
Sodium: 138 mmol/L (ref 135–145)

## 2022-01-31 LAB — CBG MONITORING, ED
Glucose-Capillary: 205 mg/dL — ABNORMAL HIGH (ref 70–99)
Glucose-Capillary: 202 mg/dL — ABNORMAL HIGH (ref 70–99)
Glucose-Capillary: 342 mg/dL — ABNORMAL HIGH (ref 70–99)

## 2022-01-31 MED ORDER — HYDRALAZINE HCL 20 MG/ML IJ SOLN: 10.0000 mg | Freq: Three times a day (TID) | INTRAMUSCULAR | Status: DC | PRN

## 2022-01-31 MED ORDER — ENOXAPARIN SODIUM 40 MG/0.4ML IJ SOSY
40.0000 mg | PREFILLED_SYRINGE | INTRAMUSCULAR | Status: DC
  Administered 2022-01-31 – 2022-02-02 (×3): 40 mg via SUBCUTANEOUS
  Filled 2022-01-31 (×3): qty 0.4

## 2022-01-31 MED ORDER — LOSARTAN POTASSIUM 50 MG PO TABS
100.0000 mg | ORAL_TABLET | Freq: Every day | ORAL | Status: DC
  Administered 2022-01-31 – 2022-02-02 (×3): 100 mg via ORAL
  Filled 2022-01-31 (×4): qty 2

## 2022-01-31 MED ORDER — HYDROCHLOROTHIAZIDE 25 MG PO TABS
25.0000 mg | ORAL_TABLET | Freq: Every day | ORAL | Status: DC
  Administered 2022-01-31 – 2022-02-02 (×3): 25 mg via ORAL
  Filled 2022-01-31 (×3): qty 1

## 2022-01-31 MED ORDER — PNEUMOCOCCAL 20-VAL CONJ VACC 0.5 ML IM SUSY
0.5000 mL | PREFILLED_SYRINGE | INTRAMUSCULAR | Status: AC
  Administered 2022-02-01: 0.5 mL via INTRAMUSCULAR
  Filled 2022-01-31: qty 0.5

## 2022-01-31 MED ORDER — POTASSIUM CHLORIDE CRYS ER 20 MEQ PO TBCR
40.0000 meq | EXTENDED_RELEASE_TABLET | ORAL | Status: AC
Start: 2022-01-31 — End: 2022-01-31
  Administered 2022-01-31 (×2): 40 meq via ORAL
  Filled 2022-01-31 (×2): qty 2

## 2022-01-31 MED ORDER — INSULIN GLARGINE-YFGN 100 UNIT/ML ~~LOC~~ SOLN
20.0000 [IU] | Freq: Every day | SUBCUTANEOUS | Status: DC
  Administered 2022-01-31: 20 [IU] via SUBCUTANEOUS
  Filled 2022-01-31: qty 0.2

## 2022-02-01 LAB — BASIC METABOLIC PANEL
Anion gap: 7 (ref 5–15)
BUN: 8 mg/dL (ref 6–20)
CO2: 23 mmol/L (ref 22–32)
Calcium: 8.6 mg/dL — ABNORMAL LOW (ref 8.9–10.3)
Chloride: 104 mmol/L (ref 98–111)
Creatinine, Ser: 0.54 mg/dL (ref 0.44–1.00)
GFR, Estimated: >60 mL/min (ref >60)
Glucose, Bld: 285 mg/dL — ABNORMAL HIGH (ref 70–99)
Potassium: 3.1 mmol/L — ABNORMAL LOW (ref 3.5–5.1)
Sodium: 134 mmol/L — ABNORMAL LOW (ref 135–145)

## 2022-02-01 LAB — HIV ANTIBODY (ROUTINE TESTING W REFLEX): HIV Screen 4th Generation wRfx: NONREACTIVE

## 2022-02-01 LAB — MAGNESIUM: Magnesium: 1.6 mg/dL — ABNORMAL LOW (ref 1.7–2.4)

## 2022-02-01 LAB — GLUCOSE, CAPILLARY
Glucose-Capillary: 279 mg/dL — ABNORMAL HIGH (ref 70–99)
Glucose-Capillary: 241 mg/dL — ABNORMAL HIGH (ref 70–99)
Glucose-Capillary: 297 mg/dL — ABNORMAL HIGH (ref 70–99)
Glucose-Capillary: 106 mg/dL — ABNORMAL HIGH (ref 70–99)

## 2022-02-01 MED ORDER — INSULIN ASPART 100 UNIT/ML IJ SOLN
5.0000 [IU] | Freq: Three times a day (TID) | INTRAMUSCULAR | Status: DC
  Administered 2022-02-01 – 2022-02-02 (×4): 5 [IU] via SUBCUTANEOUS

## 2022-02-01 MED ORDER — POTASSIUM CHLORIDE CRYS ER 20 MEQ PO TBCR
30.0000 meq | EXTENDED_RELEASE_TABLET | ORAL | Status: AC
  Administered 2022-02-01 (×3): 30 meq via ORAL
  Filled 2022-02-01 (×3): qty 1

## 2022-02-01 MED ORDER — INSULIN ASPART 100 UNIT/ML IJ SOLN
0.0000 [IU] | Freq: Three times a day (TID) | INTRAMUSCULAR | Status: DC
  Administered 2022-02-01: 8 [IU] via SUBCUTANEOUS
  Administered 2022-02-02: 2 [IU] via SUBCUTANEOUS
  Administered 2022-02-02: 3 [IU] via SUBCUTANEOUS

## 2022-02-01 MED ORDER — MAGNESIUM SULFATE 4 GM/100ML IV SOLN
4.0000 g | Freq: Once | INTRAVENOUS | Status: AC
  Administered 2022-02-01: 4 g via INTRAVENOUS
  Filled 2022-02-01: qty 100

## 2022-02-01 MED ORDER — INSULIN GLARGINE-YFGN 100 UNIT/ML ~~LOC~~ SOLN
30.0000 [IU] | Freq: Every day | SUBCUTANEOUS | Status: DC
  Administered 2022-02-01: 30 [IU] via SUBCUTANEOUS
  Filled 2022-02-01 (×2): qty 0.3

## 2022-02-02 LAB — GLUCOSE, CAPILLARY
Glucose-Capillary: 147 mg/dL — ABNORMAL HIGH (ref 70–99)
Glucose-Capillary: 223 mg/dL — ABNORMAL HIGH (ref 70–99)

## 2022-02-02 LAB — BASIC METABOLIC PANEL
Anion gap: 5 (ref 5–15)
BUN: 15 mg/dL (ref 6–20)
CO2: 24 mmol/L (ref 22–32)
Calcium: 8.6 mg/dL — ABNORMAL LOW (ref 8.9–10.3)
Chloride: 111 mmol/L (ref 98–111)
Creatinine, Ser: 0.48 mg/dL (ref 0.44–1.00)
GFR, Estimated: >60 mL/min (ref >60)
Glucose, Bld: 125 mg/dL — ABNORMAL HIGH (ref 70–99)
Potassium: 3.2 mmol/L — ABNORMAL LOW (ref 3.5–5.1)
Sodium: 140 mmol/L (ref 135–145)

## 2022-02-02 LAB — MAGNESIUM: Magnesium: 2.3 mg/dL (ref 1.7–2.4)

## 2022-02-02 MED ORDER — NOVOLIN 70/30 RELION (70-30) 100 UNIT/ML ~~LOC~~ SUSP
25.0000 [IU] | Freq: Two times a day (BID) | SUBCUTANEOUS | 2 refills | Status: DC
Start: 2022-02-02 — End: 2022-02-02

## 2022-02-02 MED ORDER — NOVOLIN 70/30 FLEXPEN RELION (70-30) 100 UNIT/ML ~~LOC~~ SUPN
25.0000 [IU] | PEN_INJECTOR | Freq: Two times a day (BID) | SUBCUTANEOUS | 2 refills | Status: DC
Start: 2022-02-02 — End: 2022-03-12

## 2022-02-02 MED ORDER — INSULIN SYRINGES (DISPOSABLE) U-100 0.5 ML MISC: 3 refills | Status: DC

## 2022-02-02 MED ORDER — BLOOD GLUCOSE MONITOR KIT: PACK | 0 refills | Status: DC

## 2022-02-02 MED ORDER — POTASSIUM CHLORIDE CRYS ER 20 MEQ PO TBCR
40.0000 meq | EXTENDED_RELEASE_TABLET | ORAL | Status: AC
Start: 2022-02-02 — End: 2022-02-02
  Administered 2022-02-02 (×2): 40 meq via ORAL
  Filled 2022-02-02 (×2): qty 2

## 2022-02-02 MED ORDER — "PEN NEEDLES 3/16"" 31G X 5 MM MISC": 2 refills | Status: DC

## 2022-02-04 LAB — HEMOGLOBIN A1C
Hgb A1c MFr Bld: >15.5 % — ABNORMAL HIGH (ref 4.8–5.6)
Mean Plasma Glucose: >398 mg/dL

## 2022-02-05 ENCOUNTER — Encounter: Payer: Self-pay | Admitting: Nurse Practitioner

## 2022-03-11 DIAGNOSIS — R7309 Other abnormal glucose: Secondary | ICD-10-CM

## 2022-03-11 LAB — GLUCOSE, POCT (MANUAL RESULT ENTRY): POC Glucose: 168 mg/dl — AB (ref 70–99)

## 2022-03-11 NOTE — Congregational Nurse Program (Signed)
  Dept: [phone]   Congregational Nurse Program Note  Date of Encounter: 03/11/2022  Past Medical History: Past Medical History:  Diagnosis Date   Hypertension     Encounter Details:  CNP Questionnaire - 03/11/22 1230       Questionnaire   Do you give verbal consent to treat you today? Yes    Location Patient [place] or Organization    Patient Status Refugee    Insurance [facility]    Insurance Referral N/A    Medication Have Medication Insecurities;Referred to Medication Assistance    Medical Provider Yes    Screening Referrals N/A    Medical Referral Urgent Care    Medical Appointment Made [facility] PCP/clinic    Food N/A    Transportation Need transportation assistance;Referred to transportation service    Interpersonal Safety N/A    Intervention Blood pressure;Advocate;Navigate Healthcare System;Case Management;Support    ED Visit Averted N/A    Life-Saving Intervention Made N/A            Patient came in to see congregational RN. Blood sugar and blood pressure elevated. BP 202/105 manual. Blood sugar 168. Patient is on insulin at home. Also on BP medication but has run out of BP meds. Noted no show to previous appointment. Advised to go to urgent care today. Has a job interview this afternoon and has declined to go to urgent care until later. Educated on risk for stroke due to high BP. Advised on medication compliance.Will schedule appointment with PCP.  Earlie Server Eilah Common RN BSN [place] Nurse [phone]

## 2022-03-12 ENCOUNTER — Encounter (HOSPITAL_COMMUNITY): Payer: Self-pay

## 2022-03-12 ENCOUNTER — Telehealth: Payer: Self-pay

## 2022-03-12 ENCOUNTER — Other Ambulatory Visit (HOSPITAL_COMMUNITY): Payer: Self-pay

## 2022-03-12 ENCOUNTER — Ambulatory Visit (HOSPITAL_COMMUNITY)
Admission: EM | Admit: 2022-03-12 | Discharge: 2022-03-12 | Disposition: A | Discharge status: Home / Self Care | Payer: Medicaid Other | Attending: Sports Medicine | Admitting: Sports Medicine

## 2022-03-12 DIAGNOSIS — E1159 Type 2 diabetes mellitus with other circulatory complications: Secondary | ICD-10-CM

## 2022-03-12 DIAGNOSIS — Z794 Long term (current) use of insulin: Secondary | ICD-10-CM

## 2022-03-12 DIAGNOSIS — I10 Essential (primary) hypertension: Secondary | ICD-10-CM | POA: Diagnosis not present

## 2022-03-12 DIAGNOSIS — Z760 Encounter for issue of repeat prescription: Secondary | ICD-10-CM

## 2022-03-12 DIAGNOSIS — E1165 Type 2 diabetes mellitus with hyperglycemia: Secondary | ICD-10-CM

## 2022-03-12 DIAGNOSIS — Z0000 Encounter for general adult medical examination without abnormal findings: Secondary | ICD-10-CM

## 2022-03-12 HISTORY — DX: Type 2 diabetes mellitus without complications: E11.9

## 2022-03-12 MED ORDER — METFORMIN HCL 500 MG PO TABS: 500.0000 mg | ORAL_TABLET | Freq: Two times a day (BID) | ORAL | 0 refills | Status: DC

## 2022-03-12 MED ORDER — LOSARTAN POTASSIUM-HCTZ 100-25 MG PO TABS: 1.0000 | ORAL_TABLET | Freq: Every day | ORAL | 0 refills | Status: DC

## 2022-03-12 MED ORDER — NOVOLIN 70/30 FLEXPEN RELION (70-30) 100 UNIT/ML ~~LOC~~ SUPN
25.0000 [IU] | PEN_INJECTOR | Freq: Two times a day (BID) | SUBCUTANEOUS | 0 refills | Status: DC
  Filled 2022-03-12: qty 15, 30d supply, fill #0

## 2022-03-12 MED ORDER — NOVOLIN 70/30 FLEXPEN RELION (70-30) 100 UNIT/ML ~~LOC~~ SUPN: 25.0000 [IU] | PEN_INJECTOR | Freq: Two times a day (BID) | SUBCUTANEOUS | 2 refills | Status: DC

## 2022-03-12 MED ORDER — METFORMIN HCL 500 MG PO TABS
500.0000 mg | ORAL_TABLET | Freq: Two times a day (BID) | ORAL | 0 refills | Status: DC
  Filled 2022-03-12 (×2): qty 60, 30d supply, fill #0

## 2022-03-12 MED ORDER — LOSARTAN POTASSIUM-HCTZ 100-25 MG PO TABS
1.0000 | ORAL_TABLET | Freq: Every day | ORAL | 0 refills | Status: DC
  Filled 2022-03-12 (×2): qty 30, 30d supply, fill #0

## 2022-03-12 MED ORDER — "PEN NEEDLES 3/16"" 31G X 5 MM MISC"
0 refills | Status: DC
  Filled 2022-03-12: qty 100, 50d supply, fill #0

## 2022-03-12 MED ORDER — "PEN NEEDLES 3/16"" 31G X 5 MM MISC": 2 refills | Status: DC

## 2022-03-12 NOTE — ED Provider Notes (Signed)
[place]    CSN: [id number] Arrival date & time: 03/12/22  0803      History   Chief Complaint Chief Complaint  Patient presents with   Diabetes    HPI Anna Stephens is a 56 y.o. female.   She presented today requesting medication refill for hypertension and diabetes.  Swahili translator was used for the duration of the visit.  She requested medication refills yesterday to the nurse that is at the same location as her English classes.  She handed me a referral form that states at that time her blood pressure was 202/105 and blood sugar was 168 on 03/11/2022.  She was referred to urgent care for medication refill.  Patient states she last had her medication yesterday.  She denies any shortness of breath, chest pain, dysuria, blurry vision or weakness.  She does endorse a headache today but she states this is normal for her.  She does not remember where her primary care office is.   Diabetes    Past Medical History:  Diagnosis Date   Diabetes mellitus without complication ([place])    Hypertension     Patient Active Problem List   Diagnosis Date Noted   Hyperosmolar hyperglycemic state (HHS) ([place]) 01/30/2022   DM2 (diabetes mellitus, type 2) ([place]) 01/30/2022   HTN (hypertension) 01/30/2022   Morbid obesity ([place]) 01/30/2022   Health care maintenance 01/28/2022    History reviewed. No pertinent surgical history.  OB History   No obstetric history on file.      Home Medications    Prior to Admission medications   Medication Sig Start Date End Date Taking? Authorizing Provider  blood glucose meter kit and supplies KIT Dispense based on patient and insurance preference. Use up to four times daily as directed. 02/02/22   [place] Donnamarie Poag, DO  insulin isophane & regular human KwikPen (NOVOLIN 70/30 KWIKPEN) (70-30) 100 UNIT/ML KwikPen Inject 25 Units into the skin 2 (two) times daily before a meal. 03/12/22 06/10/22  Rodena Goldmann A, DO  Insulin Pen Needle  (PEN NEEDLES 3/16") 31G X 5 MM MISC Use with insulin pen as directed 03/12/22   Elmore Guise, DO  losartan-hydrochlorothiazide (HYZAAR) 100-25 MG tablet Take 1 tablet by mouth daily. 03/12/22   Elmore Guise, DO  metFORMIN (GLUCOPHAGE) 500 MG tablet Take 1 tablet (500 mg total) by mouth 2 (two) times daily with a meal. 03/12/22   Elmore Guise, DO    Family History Family History  Problem Relation Age of Onset   Diabetes Father     Social History Social History   Tobacco Use   Smokeless tobacco: Never  Vaping Use   Vaping Use: Never used  Substance Use Topics   Drug use: Not Currently     Allergies   Patient has no known allergies.   Review of Systems Review of Systems as listed above in HPI   Physical Exam Triage Vital Signs ED Triage Vitals [03/12/22 0830]  Enc Vitals Group     BP (!) 175/110     Pulse Rate 80     Resp 18     Temp 97.6 F (36.4 C)     Temp Source Oral     SpO2 95 %     Weight      Height      Head Circumference      Peak Flow      Pain Score      Pain Loc  Pain Edu?      Excl. in [place]?    No data found.  Updated Vital Signs BP (!) 168/128 (BP Location: Left Arm)   Pulse 80   Temp 97.6 F (36.4 C) (Oral)   Resp 18   SpO2 95%   Physical Exam Vitals reviewed.  Constitutional:      General: She is not in acute distress.    Appearance: Normal appearance. She is obese. She is not ill-appearing, toxic-appearing or diaphoretic.  HENT:     Head: Normocephalic.     Mouth/Throat:     Mouth: Mucous membranes are moist.  Cardiovascular:     Rate and Rhythm: Normal rate and regular rhythm.  Pulmonary:     Effort: Pulmonary effort is normal. No respiratory distress.     Breath sounds: No wheezing or rales.  Skin:    General: Skin is warm.  Neurological:     Mental Status: She is alert.  Psychiatric:        Mood and Affect: Mood normal.        Behavior: Behavior normal.        Thought Content: Thought content  normal.        Judgment: Judgment normal.      UC Treatments / Results  Labs (all labs ordered are listed, but only abnormal results are displayed) Labs Reviewed - No data to display  EKG   Radiology No results found.  Procedures Procedures (including critical care time)  Medications Ordered in UC Medications - No data to display  Initial Impression / Assessment and Plan / UC Course  I have reviewed the triage vital signs and the nursing notes.  Pertinent labs & imaging results that were available during my care of the patient were reviewed by me and considered in my medical decision making (see chart for details).     Patient came in requesting refills of medication.  I sent her medications to [facility] outpatient pharmacy on [address].  Medications that I sent were same as what she was discharged from the hospital with on 01/30/2022 as she could not tell me her medications or doses.  At this time patient is asymptomatic from her hypertension.  I discussed with her the dangers of high blood pressure, counseled on ER precautions.  She verbalized understanding. Nurse Earlie Server who patient saw yesterday called during the visit and I discussed with her that I have sent the medications to [facility] health outpatient pharmacy, she verbalized she would be able to help the patient get those medications today.  She will also help to schedule patient with primary care provider visit. Final Clinical Impressions(s) / UC Diagnoses   Final diagnoses:  Health care maintenance     Discharge Instructions      You were here today for refill on your blood pressure medication and your diabetes medication.  I have sent refills of your blood pressure medicine, hydrochlorothiazide-losartan, your diabetes medicine metformin 500 twice a day and your insulin pens and needles to the [facility] outpatient pharmacy on [address] spoke with nurse Earlie Server on the phone she will help you with picking up  medications.  If you begin to have any symptoms such as chest pain, shortness of breath, blurry vision or weakness please go to the closest ER.  Ms. Earlie Server will also help you schedule an appointment with your primary care provider which will you will need very close follow-up for your medical conditions    ED Prescriptions  Medication Sig Dispense Auth. Provider   metFORMIN (GLUCOPHAGE) 500 MG tablet  (Status: Discontinued) Take 1 tablet (500 mg total) by mouth 2 (two) times daily with a meal. 60 tablet Magdalynn Davilla A, DO   insulin isophane & regular human KwikPen (NOVOLIN 70/30 KWIKPEN) (70-30) 100 UNIT/ML KwikPen  (Status: Discontinued) Inject 25 Units into the skin 2 (two) times daily before a meal. 15 mL Tiajuana Leppanen A, DO   Insulin Pen Needle (PEN NEEDLES 3/16") 31G X 5 MM MISC  (Status: Discontinued) Use with insulin pen as directed 100 each Marlyne Totaro A, DO   losartan-hydrochlorothiazide (HYZAAR) 100-25 MG tablet  (Status: Discontinued) Take 1 tablet by mouth daily. 30 tablet Rodena Goldmann A, DO   metFORMIN (GLUCOPHAGE) 500 MG tablet Take 1 tablet (500 mg total) by mouth 2 (two) times daily with a meal. 60 tablet Shawnetta Lein A, DO   losartan-hydrochlorothiazide (HYZAAR) 100-25 MG tablet Take 1 tablet by mouth daily. 30 tablet Rodena Goldmann A, DO   Insulin Pen Needle (PEN NEEDLES 3/16") 31G X 5 MM MISC Use with insulin pen as directed 100 each Lakara Weiland A, DO   insulin isophane & regular human KwikPen (NOVOLIN 70/30 KWIKPEN) (70-30) 100 UNIT/ML KwikPen Inject 25 Units into the skin 2 (two) times daily before a meal. 15 mL Linell Meldrum A, DO      PDMP not reviewed this encounter.   Rodena Goldmann A, DO 03/12/22 [phone]

## 2022-03-12 NOTE — Congregational Nurse Program (Signed)
  Dept: [phone]   Congregational Nurse Program Note  Date of Encounter: 03/12/2022  Past Medical History: Past Medical History:  Diagnosis Date   Diabetes mellitus without complication ([place])    Hypertension     Encounter Details:  CNP Questionnaire - 03/12/22 1200       Questionnaire   Do you give verbal consent to treat you today? Yes    Location Patient [place] or Organization    Patient Status Refugee    Insurance [employer]    Insurance Referral N/A    Medication Have Medication Insecurities;Referred to Medication Assistance    Medical Provider Yes    Screening Referrals N/A    Medical Referral Urgent Care    Medical Appointment Made [facility] PCP/clinic    Food N/A    Transportation Need transportation assistance;Referred to transportation service    Housing/Utilities N/A    Interpersonal Safety N/A    Intervention Advocate;Navigate Healthcare System;Case Management;Support    ED Visit Averted Yes    Life-Saving Intervention Made N/A            Transportation assistance provided to and from urgent care. Prescriptions sent to [facility] outpatient pharmacy will be picked up for home delivery today.  Earlie Server Lenette Rau RN BSN [facility] [place] Nurse [phone]

## 2022-03-12 NOTE — Discharge Instructions (Addendum)
You were here today for refill on your blood pressure medication and your diabetes medication.  I have sent refills of your blood pressure medicine, hydrochlorothiazide-losartan, your diabetes medicine metformin 500 twice a day and your insulin pens and needles to the [facility] outpatient pharmacy on [address] spoke with nurse Earlie Server on the phone she will help you with picking up medications.  If you begin to have any symptoms such as chest pain, shortness of breath, blurry vision or weakness please go to the closest ER.  Ms. Earlie Server will also help you schedule an appointment with your primary care provider which will you will need very close follow-up for your medical conditions

## 2022-03-12 NOTE — ED Triage Notes (Signed)
Pt is here for refill medication refill on Insulin,Losartan,and Metformin.  Pt is requesting refill on testing supplies and needles. Pt does not remember the name of her PCP.

## 2022-03-12 NOTE — Telephone Encounter (Signed)
Urgent care follow up appointment with PCP scheduled for 04/02/22 at 11:20 am. Patient called and made aware.   Earlie Server Jammie Troup RN BSN [place] Nurse [phone]

## 2022-03-23 ENCOUNTER — Other Ambulatory Visit: Payer: Self-pay | Admitting: Sports Medicine

## 2022-03-23 ENCOUNTER — Other Ambulatory Visit (HOSPITAL_COMMUNITY): Payer: Self-pay

## 2022-03-24 ENCOUNTER — Other Ambulatory Visit (HOSPITAL_COMMUNITY): Payer: Self-pay

## 2022-03-26 ENCOUNTER — Other Ambulatory Visit (HOSPITAL_COMMUNITY): Payer: Self-pay

## 2022-03-31 ENCOUNTER — Other Ambulatory Visit: Payer: Self-pay | Admitting: Sports Medicine

## 2022-03-31 ENCOUNTER — Other Ambulatory Visit (HOSPITAL_COMMUNITY): Payer: Self-pay

## 2022-04-02 ENCOUNTER — Encounter: Payer: Self-pay | Admitting: Nurse Practitioner

## 2022-04-02 ENCOUNTER — Ambulatory Visit (INDEPENDENT_AMBULATORY_CARE_PROVIDER_SITE_OTHER): Payer: Medicaid Other | Admitting: Nurse Practitioner

## 2022-04-02 ENCOUNTER — Telehealth: Payer: Self-pay

## 2022-04-02 VITALS — BP 138/99 | HR 81 | Wt 232.0 lb

## 2022-04-02 DIAGNOSIS — E119 Type 2 diabetes mellitus without complications: Secondary | ICD-10-CM

## 2022-04-02 DIAGNOSIS — Z794 Long term (current) use of insulin: Secondary | ICD-10-CM | POA: Diagnosis not present

## 2022-04-02 DIAGNOSIS — I10 Essential (primary) hypertension: Secondary | ICD-10-CM | POA: Diagnosis not present

## 2022-04-02 LAB — POCT GLYCOSYLATED HEMOGLOBIN (HGB A1C): HbA1c, POC (controlled diabetic range): 7.4 % — AB (ref 0.0–7.0)

## 2022-04-02 LAB — GLUCOSE, POCT (MANUAL RESULT ENTRY): POC Glucose: 172 mg/dl — AB (ref 70–99)

## 2022-04-02 MED ORDER — LOSARTAN POTASSIUM-HCTZ 100-25 MG PO TABS: 1.0000 | ORAL_TABLET | Freq: Every day | ORAL | 2 refills | Status: DC

## 2022-04-02 MED ORDER — METFORMIN HCL 500 MG PO TABS: 500.0000 mg | ORAL_TABLET | Freq: Two times a day (BID) | ORAL | 2 refills | Status: DC

## 2022-04-02 MED ORDER — "PEN NEEDLES 3/16"" 31G X 5 MM MISC": 0 refills | Status: DC

## 2022-04-02 MED ORDER — LOSARTAN POTASSIUM-HCTZ 100-25 MG PO TABS: 1.0000 | ORAL_TABLET | Freq: Every day | ORAL | 0 refills | Status: DC

## 2022-04-02 MED ORDER — METFORMIN HCL 500 MG PO TABS: 500.0000 mg | ORAL_TABLET | Freq: Two times a day (BID) | ORAL | 0 refills | Status: DC

## 2022-04-02 MED ORDER — NOVOLIN 70/30 FLEXPEN RELION (70-30) 100 UNIT/ML ~~LOC~~ SUPN: 25.0000 [IU] | PEN_INJECTOR | Freq: Two times a day (BID) | SUBCUTANEOUS | 6 refills | Status: DC

## 2022-04-02 MED ORDER — NOVOLIN 70/30 FLEXPEN RELION (70-30) 100 UNIT/ML ~~LOC~~ SUPN: 25.0000 [IU] | PEN_INJECTOR | Freq: Two times a day (BID) | SUBCUTANEOUS | 0 refills | Status: DC

## 2022-04-02 MED ORDER — ACCU-CHEK SOFTCLIX LANCETS MISC: 12 refills | Status: DC

## 2022-04-02 NOTE — Progress Notes (Signed)
@Patient  ID: Anna Stephens, female    DOB: 08-08-1965, 56 y.o.   MRN: [id number]  Chief Complaint  Patient presents with   Hospitalization Follow-up   Medication Refill    Referring provider: No ref. provider found  Recent significant events: Admit date: 01/30/2022 Discharge date: 02/02/2022   Admitted From: Home Disposition: Home   Recommendations for Outpatient Follow-up:  Follow up with PCP in 1-2 weeks Started on NPH 70/30 25 units [place] BID Follow-up hemoglobin A1c that was pending at time of discharge. Needs further monitoring of glucose levels and titration of insulin   Hospital course:   Hyperosmolar hyperglycemic state Type 2 diabetes mellitus with hyperglycemia Patient presenting to ED with progressive weakness, malaise, increased urination and dry mouth.  Recently diagnosed with diabetes by her PCP and started on metformin.  Patient with elevated glucose of 854, normal anion gap and no ketones on urinalysis.  Patient was initially started on a insulin drip and now titrated off to subcutaneous insulin.  Diabetic educator was consulted and followed during hospital course.  Patient was discharged on NPH 70/30 25 units [place] BID and continue home metformin.  Instructed patient to monitor glucose twice daily, once when awakes in the morning and prior to dinner and to maintain log for next PCP visit.  Will need further titration likely outpatient.  Hemoglobin A1c pending at time of discharge.   Hypokalemia Hypomagnesemia Replaced during hospitalization.   Pseudohyponatremia Sodium 126 on admission with glucose 854; corrected for hyperglycemia = 138.   Essential hypertension Continue combination losartan 100 mg p.o. daily-HCTZ 25 mg p.o. daily   Morbid obesity Body mass index is 45.73 kg/m.  Discussed with patient needs for aggressive lifestyle changes/weight loss as this complicates all facets of care.  Outpatient follow-up with PCP.       Discharge Diagnoses:  Principal  Problem:   Hyperosmolar hyperglycemic state (HHS) ([place]) Active Problems:   DM2 (diabetes mellitus, type 2) ([place])   HTN (hypertension)   Morbid obesity ([place])  HPI  Senie Maelezo is a 56 y.o. female with past medical history significant for essential hypertension, type 2 diabetes mellitus         No Known Allergies  Immunization History  Administered Date(s) Administered   PNEUMOCOCCAL CONJUGATE-20 02/01/2022    Past Medical History:  Diagnosis Date   Diabetes mellitus without complication ([place])    Hypertension     Tobacco History: Social History   Tobacco Use  Smoking Status Not on file  Smokeless Tobacco Never   Counseling given: Not Answered   Outpatient Encounter Medications as of 04/02/2022  Medication Sig   Accu-Chek Softclix Lancets lancets Use as instructed   blood glucose meter kit and supplies KIT Dispense based on patient and insurance preference. Use up to four times daily as directed.   [DISCONTINUED] insulin isophane & regular human KwikPen (NOVOLIN 70/30 KWIKPEN) (70-30) 100 UNIT/ML KwikPen Inject 25 Units into the skin 2 (two) times daily before a meal.   [DISCONTINUED] Insulin Pen Needle (PEN NEEDLES 3/16") 31G X 5 MM MISC Use with insulin pen as directed   [DISCONTINUED] losartan-hydrochlorothiazide (HYZAAR) 100-25 MG tablet Take 1 tablet by mouth daily.   [DISCONTINUED] metFORMIN (GLUCOPHAGE) 500 MG tablet Take 1 tablet (500 mg total) by mouth 2 (two) times daily with a meal.   insulin isophane & regular human KwikPen (NOVOLIN 70/30 KWIKPEN) (70-30) 100 UNIT/ML KwikPen Inject 25 Units into the skin 2 (two) times daily before a meal.   Insulin Pen Needle (  PEN NEEDLES 3/16") 31G X 5 MM MISC Use with insulin pen as directed   losartan-hydrochlorothiazide (HYZAAR) 100-25 MG tablet Take 1 tablet by mouth daily.   metFORMIN (GLUCOPHAGE) 500 MG tablet Take 1 tablet (500 mg total) by mouth 2 (two) times daily with a meal.   [DISCONTINUED] insulin isophane  & regular human KwikPen (NOVOLIN 70/30 KWIKPEN) (70-30) 100 UNIT/ML KwikPen Inject 25 Units into the skin 2 (two) times daily before a meal.   [DISCONTINUED] losartan-hydrochlorothiazide (HYZAAR) 100-25 MG tablet Take 1 tablet by mouth daily.   [DISCONTINUED] metFORMIN (GLUCOPHAGE) 500 MG tablet Take 1 tablet (500 mg total) by mouth 2 (two) times daily with a meal.   No facility-administered encounter medications on file as of 04/02/2022.     Review of Systems  Review of Systems  Constitutional: Negative.   HENT: Negative.    Cardiovascular: Negative.   Gastrointestinal: Negative.   Allergic/Immunologic: Negative.   Neurological: Negative.   Psychiatric/Behavioral: Negative.         Physical Exam  BP (!) 138/99   Pulse 81   Wt 232 lb (105.2 kg)   SpO2 98%   BMI 42.43 kg/m   Wt Readings from Last 5 Encounters:  04/02/22 232 lb (105.2 kg)  01/30/22 250 lb (113.4 kg)  01/28/22 217 lb 12.8 oz (98.8 kg)     Physical Exam Vitals and nursing note reviewed.  Constitutional:      General: She is not in acute distress.    Appearance: She is well-developed.  Cardiovascular:     Rate and Rhythm: Normal rate and regular rhythm.  Pulmonary:     Effort: Pulmonary effort is normal.     Breath sounds: Normal breath sounds.  Neurological:     Mental Status: She is alert and oriented to person, place, and time.      Lab Results:  CBC    Component Value Date/Time   WBC 5.6 01/30/2022 1258   RBC 5.19 (H) 01/30/2022 1258   HGB 15.8 (H) 01/30/2022 1258   HGB 15.4 01/28/2022 1313   HCT 46.3 (H) 01/30/2022 1258   HCT 46.5 01/28/2022 1313   PLT 198 01/30/2022 1258   PLT 212 01/28/2022 1313   MCV 89.2 01/30/2022 1258   MCV 91 01/28/2022 1313   MCH 30.4 01/30/2022 1258   MCHC 34.1 01/30/2022 1258   RDW 12.7 01/30/2022 1258   RDW 13.3 01/28/2022 1313   LYMPHSABS 1.8 01/30/2022 1258   MONOABS 0.3 01/30/2022 1258   EOSABS 1.4 (H) 01/30/2022 1258   BASOSABS 0.0 01/30/2022  1258    BMET    Component Value Date/Time   NA 140 02/02/2022 0421   NA 136 01/28/2022 1313   K 3.2 (L) 02/02/2022 0421   CL 111 02/02/2022 0421   CO2 24 02/02/2022 0421   GLUCOSE 125 (H) 02/02/2022 0421   BUN 15 02/02/2022 0421   BUN 7 01/28/2022 1313   CREATININE 0.48 02/02/2022 0421   CALCIUM 8.6 (L) 02/02/2022 0421   GFRNONAA >60 02/02/2022 0421    BNP    Component Value Date/Time   BNP 11.4 10/08/2021 1357     Assessment & Plan:   HTN (hypertension) - losartan-hydrochlorothiazide (HYZAAR) 100-25 MG tablet; Take 1 tablet by mouth daily.  Dispense: 30 tablet; Refill: 2  2. Type 2 diabetes mellitus without complication, with long-term current use of insulin (HCC)  - CBC - Comprehensive metabolic panel - Insulin Pen Needle (PEN NEEDLES 3/16") 31G X 5 MM MISC; Use with  insulin pen as directed  Dispense: 100 each; Refill: 0 - Accu-Chek Softclix Lancets lancets; Use as instructed  Dispense: 100 each; Refill: 12 - insulin isophane & regular human KwikPen (NOVOLIN 70/30 KWIKPEN) (70-30) 100 UNIT/ML KwikPen; Inject 25 Units into the skin 2 (two) times daily before a meal.  Dispense: 15 mL; Refill: 6 - metFORMIN (GLUCOPHAGE) 500 MG tablet; Take 1 tablet (500 mg total) by mouth 2 (two) times daily with a meal.  Dispense: 60 tablet; Refill: 2  -diabetic diet  -stay active   Follow up:  Follow up in 3 months or sooner if needed     Fenton Foy, NP 04/02/2022

## 2022-04-02 NOTE — Patient Instructions (Addendum)
1. Primary hypertension  - losartan-hydrochlorothiazide (HYZAAR) 100-25 MG tablet; Take 1 tablet by mouth daily.  Dispense: 30 tablet; Refill: 2  2. Type 2 diabetes mellitus without complication, with long-term current use of insulin (HCC)    - CBC - Comprehensive metabolic panel - Insulin Pen Needle (PEN NEEDLES 3/16") 31G X 5 MM MISC; Use with insulin pen as directed  Dispense: 100 each; Refill: 0 - Accu-Chek Softclix Lancets lancets; Use as instructed  Dispense: 100 each; Refill: 12 - insulin isophane & regular human KwikPen (NOVOLIN 70/30 KWIKPEN) (70-30) 100 UNIT/ML KwikPen; Inject 25 Units into the skin 2 (two) times daily before a meal.  Dispense: 15 mL; Refill: 6 - metFORMIN (GLUCOPHAGE) 500 MG tablet; Take 1 tablet (500 mg total) by mouth 2 (two) times daily with a meal.  Dispense: 60 tablet; Refill: 2  -diabetic diet  -stay active   Follow up:  Follow up in 3 months or sooner if needed

## 2022-04-02 NOTE — Progress Notes (Signed)
Medication refill

## 2022-04-02 NOTE — Telephone Encounter (Signed)
Patient called with appointment reminder.Requesting transportation assistance. Ride scheduled.  Earlie Server Rickesha Veracruz RN BSN [place] Nurse [phone]

## 2022-04-02 NOTE — Assessment & Plan Note (Signed)
-   losartan-hydrochlorothiazide (HYZAAR) 100-25 MG tablet; Take 1 tablet by mouth daily.  Dispense: 30 tablet; Refill: 2  2. Type 2 diabetes mellitus without complication, with long-term current use of insulin (HCC)  - CBC - Comprehensive metabolic panel - Insulin Pen Needle (PEN NEEDLES 3/16") 31G X 5 MM MISC; Use with insulin pen as directed  Dispense: 100 each; Refill: 0 - Accu-Chek Softclix Lancets lancets; Use as instructed  Dispense: 100 each; Refill: 12 - insulin isophane & regular human KwikPen (NOVOLIN 70/30 KWIKPEN) (70-30) 100 UNIT/ML KwikPen; Inject 25 Units into the skin 2 (two) times daily before a meal.  Dispense: 15 mL; Refill: 6 - metFORMIN (GLUCOPHAGE) 500 MG tablet; Take 1 tablet (500 mg total) by mouth 2 (two) times daily with a meal.  Dispense: 60 tablet; Refill: 2  -diabetic diet  -stay active   Follow up:  Follow up in 3 months or sooner if needed

## 2022-04-03 LAB — COMPREHENSIVE METABOLIC PANEL
ALT: 14 IU/L (ref 0–32)
AST: 12 IU/L (ref 0–40)
Albumin/Globulin Ratio: 1.3 (ref 1.2–2.2)
Albumin: 4.1 g/dL (ref 3.8–4.9)
Alkaline Phosphatase: 55 IU/L (ref 44–121)
BUN/Creatinine Ratio: 18 (ref 9–23)
BUN: 13 mg/dL (ref 6–24)
Bilirubin Total: 0.4 mg/dL (ref 0.0–1.2)
CO2: 27 mmol/L (ref 20–29)
Calcium: 9.1 mg/dL (ref 8.7–10.2)
Chloride: 101 mmol/L (ref 96–106)
Creatinine, Ser: 0.72 mg/dL (ref 0.57–1.00)
Globulin, Total: 3.1 g/dL (ref 1.5–4.5)
Glucose: 171 mg/dL — ABNORMAL HIGH (ref 70–99)
Potassium: 3.3 mmol/L — ABNORMAL LOW (ref 3.5–5.2)
Sodium: 141 mmol/L (ref 134–144)
Total Protein: 7.2 g/dL (ref 6.0–8.5)
eGFR: 99 mL/min/{1.73_m2} (ref >59)

## 2022-04-03 LAB — CBC
Hematocrit: 39.2 % (ref 34.0–46.6)
Hemoglobin: 13.3 g/dL (ref 11.1–15.9)
MCH: 30.9 pg (ref 26.6–33.0)
MCHC: 33.9 g/dL (ref 31.5–35.7)
MCV: 91 fL (ref 79–97)
Platelets: 207 10*3/uL (ref 150–450)
RBC: 4.31 x10E6/uL (ref 3.77–5.28)
RDW: 11.8 % (ref 11.7–15.4)
WBC: 5.7 10*3/uL (ref 3.4–10.8)

## 2022-05-22 ENCOUNTER — Other Ambulatory Visit: Payer: Self-pay | Admitting: Nurse Practitioner

## 2022-05-22 DIAGNOSIS — E119 Type 2 diabetes mellitus without complications: Secondary | ICD-10-CM

## 2022-05-22 NOTE — Telephone Encounter (Signed)
Caller & Relationship to patient:  MRN #  [id number]   Call Back Number:   Date of Last Office Visit: 04/02/2022     Date of Next Office Visit: 07/02/2022    Medication(s) to be Refilled: Insulin  Preferred Pharmacy: [facility]  ** Please notify patient to allow 48-72 hours to process** **Let patient know to contact pharmacy at the end of the day to make sure medication is ready. ** **If patient has not been seen in a year or longer, book an appointment **Advise to use MyChart for refill requests OR to contact their pharmacy

## 2022-05-25 ENCOUNTER — Emergency Department (HOSPITAL_BASED_OUTPATIENT_CLINIC_OR_DEPARTMENT_OTHER)
Admission: EM | Admit: 2022-05-25 | Discharge: 2022-05-25 | Disposition: A | Discharge status: Home / Self Care | Payer: Medicaid Other | Attending: Emergency Medicine | Admitting: Emergency Medicine

## 2022-05-25 ENCOUNTER — Other Ambulatory Visit: Payer: Self-pay

## 2022-05-25 ENCOUNTER — Encounter (HOSPITAL_BASED_OUTPATIENT_CLINIC_OR_DEPARTMENT_OTHER): Payer: Self-pay | Admitting: Emergency Medicine

## 2022-05-25 DIAGNOSIS — Z79899 Other long term (current) drug therapy: Secondary | ICD-10-CM | POA: Diagnosis not present

## 2022-05-25 DIAGNOSIS — Z794 Long term (current) use of insulin: Secondary | ICD-10-CM

## 2022-05-25 DIAGNOSIS — I10 Essential (primary) hypertension: Secondary | ICD-10-CM | POA: Diagnosis not present

## 2022-05-25 DIAGNOSIS — Z760 Encounter for issue of repeat prescription: Secondary | ICD-10-CM | POA: Diagnosis not present

## 2022-05-25 DIAGNOSIS — Z7984 Long term (current) use of oral hypoglycemic drugs: Secondary | ICD-10-CM | POA: Insufficient documentation

## 2022-05-25 DIAGNOSIS — E119 Type 2 diabetes mellitus without complications: Secondary | ICD-10-CM | POA: Diagnosis not present

## 2022-05-25 LAB — CBG MONITORING, ED: Glucose-Capillary: 207 mg/dL — ABNORMAL HIGH (ref 70–99)

## 2022-05-25 MED ORDER — ACCU-CHEK SOFTCLIX LANCETS MISC: 12 refills | Status: DC

## 2022-05-25 MED ORDER — METFORMIN HCL 500 MG PO TABS: 500.0000 mg | ORAL_TABLET | Freq: Two times a day (BID) | ORAL | 2 refills | Status: DC

## 2022-05-25 MED ORDER — LOSARTAN POTASSIUM-HCTZ 100-25 MG PO TABS: 1.0000 | ORAL_TABLET | Freq: Every day | ORAL | 2 refills | Status: DC

## 2022-05-25 MED ORDER — "PEN NEEDLES 3/16"" 31G X 5 MM MISC": 0 refills | Status: DC

## 2022-05-25 MED ORDER — NOVOLIN 70/30 FLEXPEN (70-30) 100 UNIT/ML ~~LOC~~ SUPN: PEN_INJECTOR | SUBCUTANEOUS | 0 refills | Status: DC

## 2022-05-25 MED ORDER — BLOOD GLUCOSE MONITOR KIT: PACK | 0 refills | Status: DC

## 2022-05-25 NOTE — Discharge Instructions (Signed)
Take your medications as prescribed.  Follow-up with your primary as needed for refills.

## 2022-05-25 NOTE — ED Provider Notes (Signed)
[place] EMERGENCY DEPT Provider Note   CSN: [id number] Arrival date & time: 05/25/22  0355     History  Chief Complaint  Patient presents with   Medication Refill    Anna Stephens is a 56 y.o. female.  The history is provided by the patient, a caregiver and medical records. The history is limited by a language barrier. A language interpreter was used.  Medication Refill    Patient is medical history of hypertension and diabetes presents to the ED for medication refill.  She ran out of her blood pressure medicine about 3 to 4 days ago.  Patient's case manager is with her to help facilitate care.  Patient is a refugee from some [place], she does have a primary care provider but they have not been able to get in contact with them for refills over the weekend.  Patient is asymptomatic, denies any chest pain, vision changes, shortness of breath, weakness.  She is not sure what medication she takes but states this should be updated in the system..  Home Medications Prior to Admission medications   Medication Sig Start Date End Date Taking? Authorizing Provider  Accu-Chek Softclix Lancets lancets Use as instructed 05/25/22   Sherrill Raring, PA-C  blood glucose meter kit and supplies KIT Dispense based on patient and insurance preference. Use up to four times daily as directed. 05/25/22   Sherrill Raring, PA-C  insulin isophane & regular human KwikPen (NOVOLIN 70/30 KWIKPEN) (70-30) 100 UNIT/ML KwikPen INJECT 25 UNITS SUBCUTANEOUSLY TWICE DAILY BEFORE A MEAL 05/25/22   Sherrill Raring, PA-C  Insulin Pen Needle (PEN NEEDLES 3/16") 31G X 5 MM MISC Use with insulin pen as directed 05/25/22   Sherrill Raring, PA-C  losartan-hydrochlorothiazide (HYZAAR) 100-25 MG tablet Take 1 tablet by mouth daily. 05/25/22 08/23/22  Sherrill Raring, PA-C  metFORMIN (GLUCOPHAGE) 500 MG tablet Take 1 tablet (500 mg total) by mouth 2 (two) times daily with a meal. 05/25/22 08/23/22  Sherrill Raring, PA-C       Allergies    Patient has no known allergies.    Review of Systems   Review of Systems  Physical Exam Updated Vital Signs BP (!) 158/100 (BP Location: Right Arm)   Pulse 77   Temp 98.1 F (36.7 C) (Oral)   Resp 16   SpO2 100%  Physical Exam Vitals and nursing note reviewed. Exam conducted with a chaperone present.  Constitutional:      Appearance: Normal appearance.  HENT:     Head: Normocephalic and atraumatic.  Eyes:     General: No scleral icterus.       Right eye: No discharge.        Left eye: No discharge.     Extraocular Movements: Extraocular movements intact.     Pupils: Pupils are equal, round, and reactive to light.  Cardiovascular:     Rate and Rhythm: Normal rate and regular rhythm.     Pulses: Normal pulses.     Heart sounds: Normal heart sounds. No murmur heard.    No friction rub. No gallop.  Pulmonary:     Effort: Pulmonary effort is normal. No respiratory distress.     Breath sounds: Normal breath sounds.  Abdominal:     General: Abdomen is flat. Bowel sounds are normal. There is no distension.     Palpations: Abdomen is soft.     Tenderness: There is no abdominal tenderness.  Skin:    General: Skin is warm and dry.  Coloration: Skin is not jaundiced.  Neurological:     Mental Status: She is alert. Mental status is at baseline.     Coordination: Coordination normal.     ED Results / Procedures / Treatments   Labs (all labs ordered are listed, but only abnormal results are displayed) Labs Reviewed  CBG MONITORING, ED - Abnormal; Notable for the following components:      Result Value   Glucose-Capillary 207 (*)    All other components within normal limits    EKG None  Radiology No results found.  Procedures Procedures    Medications Ordered in ED Medications - No data to display  ED Course/ Medical Decision Making/ A&P                           Medical Decision Making Risk OTC drugs. Prescription drug  management.   This is a 56 year old female presenting to the emergency department for medication refill.  She is asymptomatic, physical exam is unrevealing.  Patient's case manager is at bedside providing independent history, additionally virtual translator was used and reviewed external medical records including patient's home medication list which I reviewed with the patient and caregiver for accuracy.    I did send refills to their preferred pharmacy, given patient is asymptomatic and glucose is elevated but not significantly so in the 200s I think it is reasonable to have him follow-up outpatient.   I initially consulted social work but given patient actually has a primary do not think we need social work intervention at this time.  Patient is stable for discharge.        Final Clinical Impression(s) / ED Diagnoses Final diagnoses:  Medication refill    Rx / DC Orders ED Discharge Orders          Ordered    insulin isophane & regular human KwikPen (NOVOLIN 70/30 KWIKPEN) (70-30) 100 UNIT/ML KwikPen        05/25/22 1037    metFORMIN (GLUCOPHAGE) 500 MG tablet  2 times daily with meals        05/25/22 1037    losartan-hydrochlorothiazide (HYZAAR) 100-25 MG tablet  Daily        05/25/22 1037    Insulin Pen Needle (PEN NEEDLES 3/16") 31G X 5 MM MISC        05/25/22 1037    blood glucose meter kit and supplies KIT        05/25/22 1037    Accu-Chek Softclix Lancets lancets        05/25/22 1037              Sherrill Raring, PA-C 05/25/22 1041    Deno Etienne, DO 05/25/22 1127

## 2022-05-25 NOTE — ED Notes (Signed)
Late entry

## 2022-05-25 NOTE — ED Notes (Signed)
Dc instructions reviewed with patient. Patient voiced understanding. Dc with belongings.  °

## 2022-05-25 NOTE — ED Triage Notes (Signed)
Pt is refugee and staying here in [place], contact is case manager Darrel Hoover [phone]

## 2022-05-25 NOTE — ED Triage Notes (Signed)
Triaged with assistance of interpreter via Elesa Massed. I have high blood pressure and diabetes. She has been out for few days. Case manager called Friday for refill, no answers.cbg 400's per patient. States "not feeling good",

## 2022-07-02 ENCOUNTER — Ambulatory Visit: Payer: Self-pay | Admitting: Nurse Practitioner

## 2022-07-02 NOTE — Progress Notes (Deleted)
  Subjective:    Patient ID: Anna Stephens, female    DOB: 1965/10/29, 57 y.o.   MRN: [id number]  Anna Stephens is a 57 y.o. female who presents for follow-up of Type 2 diabetes mellitus.  Patient {is/are not:32546} checking home blood sugars.   Home blood sugar records: {dm home sugars:14018} How often is blood sugars being checked: *** Current symptoms/problems include {Symptoms; diabetes:14075} and have been {Desc; course:15616}. Daily foot checks: ***   Any foot concerns: *** Last eye exam: *** Exercise: {types:19826}  The following portions of the patient's history were reviewed and updated as appropriate: allergies, current medications, past medical history, past social history and problem list.  ROS as in subjective above.     Objective:    Physical Exam Alert and in no distress otherwise not examined.  There were no vitals taken for this visit.  Lab Review    Latest Ref Rng & Units 04/02/2022   11:36 AM 04/02/2022   11:31 AM 02/02/2022    4:21 AM 02/01/2022    4:12 AM 01/31/2022    4:00 AM  Diabetic Labs  HbA1c 0.0 - 7.0 %  7.4    >15.5   Creatinine 0.57 - 1.00 mg/dL 0.72   0.48  0.54  0.49       05/25/2022   10:53 AM 05/25/2022    9:30 AM 04/02/2022   10:59 AM 03/12/2022    9:00 AM 03/12/2022    8:30 AM  BP/Weight  Systolic BP 829 937 169 678 938  Diastolic BP 99 101 99 751 110  Wt. (Lbs)   232    BMI   42.43 kg/m2         No data to display          Anna Stephens  reports that she has never smoked. She has never used smokeless tobacco. She reports that she does not currently use drugs.     Assessment & Plan:    No diagnosis found.  Rx changes: {none:33079} Education: Reviewed 'ABCs' of diabetes management (respective goals in parentheses):  A1C (<7), blood pressure (<130/80), and cholesterol (LDL <100). Compliance at present is estimated to be {good/fair/poor:33178}. Efforts to improve compliance (if necessary) will be directed at  {compliance:16716}. Follow up: {NUMBERS; 0-10:33138} {time:11}

## 2022-08-17 ENCOUNTER — Ambulatory Visit: Payer: Medicaid Other | Admitting: Student

## 2022-08-26 ENCOUNTER — Other Ambulatory Visit: Payer: Self-pay

## 2022-08-26 ENCOUNTER — Ambulatory Visit (HOSPITAL_COMMUNITY)
Admission: EM | Admit: 2022-08-26 | Discharge: 2022-08-26 | Disposition: A | Discharge status: Home / Self Care | Payer: Medicaid Other | Attending: Family Medicine | Admitting: Family Medicine

## 2022-08-26 ENCOUNTER — Encounter (HOSPITAL_COMMUNITY): Payer: Self-pay | Admitting: *Deleted

## 2022-08-26 DIAGNOSIS — R1030 Lower abdominal pain, unspecified: Secondary | ICD-10-CM | POA: Diagnosis not present

## 2022-08-26 DIAGNOSIS — Z794 Long term (current) use of insulin: Secondary | ICD-10-CM | POA: Insufficient documentation

## 2022-08-26 DIAGNOSIS — E119 Type 2 diabetes mellitus without complications: Secondary | ICD-10-CM | POA: Insufficient documentation

## 2022-08-26 DIAGNOSIS — E1165 Type 2 diabetes mellitus with hyperglycemia: Secondary | ICD-10-CM

## 2022-08-26 LAB — COMPREHENSIVE METABOLIC PANEL
ALT: 15 U/L (ref 0–44)
AST: 16 U/L (ref 15–41)
Albumin: 3.8 g/dL (ref 3.5–5.0)
Alkaline Phosphatase: 62 U/L (ref 38–126)
Anion gap: 15 (ref 5–15)
BUN: 13 mg/dL (ref 6–20)
CO2: 28 mmol/L (ref 22–32)
Calcium: 9.1 mg/dL (ref 8.9–10.3)
Chloride: 91 mmol/L — ABNORMAL LOW (ref 98–111)
Creatinine, Ser: 0.87 mg/dL (ref 0.44–1.00)
GFR, Estimated: >60 mL/min (ref >60)
Glucose, Bld: 332 mg/dL — ABNORMAL HIGH (ref 70–99)
Potassium: 3.9 mmol/L (ref 3.5–5.1)
Sodium: 134 mmol/L — ABNORMAL LOW (ref 135–145)
Total Bilirubin: 0.7 mg/dL (ref 0.3–1.2)
Total Protein: 7.9 g/dL (ref 6.5–8.1)

## 2022-08-26 LAB — CBC WITH DIFFERENTIAL/PLATELET
Abs Immature Granulocytes: 0.01 10*3/uL (ref 0.00–0.07)
Basophils Absolute: 0.1 10*3/uL (ref 0.0–0.1)
Basophils Relative: 1 %
Eosinophils Absolute: 1.9 10*3/uL — ABNORMAL HIGH (ref 0.0–0.5)
Eosinophils Relative: 26 %
HCT: 45.6 % (ref 36.0–46.0)
Hemoglobin: 15.3 g/dL — ABNORMAL HIGH (ref 12.0–15.0)
Immature Granulocytes: 0 %
Lymphocytes Relative: 39 %
Lymphs Abs: 2.8 10*3/uL (ref 0.7–4.0)
MCH: 29.4 pg (ref 26.0–34.0)
MCHC: 33.6 g/dL (ref 30.0–36.0)
MCV: 87.5 fL (ref 80.0–100.0)
Monocytes Absolute: 0.4 10*3/uL (ref 0.1–1.0)
Monocytes Relative: 6 %
Neutro Abs: 2 10*3/uL (ref 1.7–7.7)
Neutrophils Relative %: 28 %
Platelets: 257 10*3/uL (ref 150–400)
RBC: 5.21 MIL/uL — ABNORMAL HIGH (ref 3.87–5.11)
RDW: 11.6 % (ref 11.5–15.5)
WBC: 7.3 10*3/uL (ref 4.0–10.5)
nRBC: 0 % (ref 0.0–0.2)

## 2022-08-26 LAB — LIPASE, BLOOD: Lipase: 46 U/L (ref 11–51)

## 2022-08-26 LAB — CBG MONITORING, ED: Glucose-Capillary: 408 mg/dL — ABNORMAL HIGH (ref 70–99)

## 2022-08-26 MED ORDER — ONDANSETRON 4 MG PO TBDP: 4.0000 mg | ORAL_TABLET | Freq: Three times a day (TID) | ORAL | 0 refills | Status: AC | PRN

## 2022-08-26 MED ORDER — ACCU-CHEK SOFTCLIX LANCETS MISC: 12 refills | Status: AC

## 2022-08-26 NOTE — Discharge Instructions (Signed)
You have been seen today for abdominal pain. Your evaluation was not suggestive of any emergent condition requiring medical intervention at this time. However, some abdominal problems make take more time to appear. Therefore, it is very important for you to pay attention to any new symptoms or worsening of your current condition.  Please return here or to the Emergency Department immediately should you begin to feel worse in any way or have any of the following symptoms: increasing or different abdominal pain, persistent vomiting, inability to drink fluids, fevers, or shaking chills.   You have had labs (blood work) sent today. We will call you with any significant abnormalities or if there is need to begin or change treatment or pursue further follow up.  You may also review your test results online through MyChart. If you do not have a MyChart account, instructions to sign up should be on your discharge paperwork.  

## 2022-08-26 NOTE — ED Triage Notes (Addendum)
Pt reports she has been sick since Monday with N/V/D. Pt reports her blood sugar has been high and BP has been high. Pt reports having her blood sugar checked at work on Monday. Pt. Was told the blood sugar was high .

## 2022-08-26 NOTE — ED Notes (Signed)
CBG 408

## 2022-08-27 NOTE — ED Provider Notes (Signed)
[facility]   [id number] 08/26/22 Arrival Time: 1104  ASSESSMENT & PLAN:  1. Lower abdominal pain   2. Type 2 diabetes mellitus with hyperglycemia, with long-term current use of insulin (HCC)    Benign abdominal exam but does report lower pain. Declines ED evaluation for imaging. Prefers to check labs and monitor over next 24 hours. Is tolerating PO intake despite some nausea; no emesis.  Meds ordered this encounter  Medications   Accu-Chek Softclix Lancets lancets    Sig: Use as instructed    Dispense:  100 each    Refill:  12   ondansetron (ZOFRAN-ODT) 4 MG disintegrating tablet    Sig: Take 1 tablet (4 mg total) by mouth every 8 (eight) hours as needed for nausea or vomiting.    Dispense:  15 tablet    Refill:  0   Work note provided.  No significant lab abnormalities to suggest cause of current symptoms. Now that she has lancets, she will be able to monitor blood sugars. If these continue to rise she agrees to ED evaluation.  Labs Reviewed  COMPREHENSIVE METABOLIC PANEL - Abnormal; Notable for the following components:      Result Value   Sodium 134 (*)    Chloride 91 (*)    Glucose, Bld 332 (*)    All other components within normal limits  CBC WITH DIFFERENTIAL/PLATELET - Abnormal; Notable for the following components:   RBC 5.21 (*)    Hemoglobin 15.3 (*)    Eosinophils Absolute 1.9 (*)    All other components within normal limits  CBG MONITORING, ED - Abnormal; Notable for the following components:   Glucose-Capillary 408 (*)    All other components within normal limits  LIPASE, BLOOD      Follow-up Information     [facility] [facility] at [facility]   Specialty: Emergency Medicine Why: If symptoms worsen in any way. Contact information: [address] [id number] [place] [phone]               Reviewed expectations re: course of current medical issues. Questions  answered. Outlined signs and symptoms indicating need for more acute intervention. Patient verbalized understanding. After Visit Summary given.   SUBJECTIVE: History from: patient. Anna Stephens is a 57 y.o. female who presents with complaint of reports nausea and lower abdominal pain; x 2-3 days; stable. No specific aggravating or alleviating factors reported. Denies fever. Feels blood sugar has been high lately; out of lancets though. Tolerating PO intake. Normal bowel movements without blood. No urinary symptoms.  No LMP recorded. Patient is postmenopausal. History reviewed. No pertinent surgical history.   OBJECTIVE:  Vitals:   08/26/22 1227  BP: 124/87  Pulse: 88  Resp: 20  Temp: 98.4 F (36.9 C)  SpO2: 95%    General appearance: alert, oriented, no acute distress HEENT: [place]; AT; oropharynx moist Lungs: unlabored respirations Abdomen: soft; without distention; mild  and poorly localized tenderness to palpation over lower abdomen ; normal bowel sounds; without masses or organomegaly; without guarding or rebound tenderness Back: without reported CVA tenderness; FROM at waist Extremities: without LE edema; symmetrical; without gross deformities Skin: warm and dry Neurologic: normal gait Psychological: alert and cooperative; normal mood and affect  Labs: Results for orders placed or performed during the hospital encounter of 08/26/22  Comprehensive metabolic panel  Result Value Ref Range   Sodium 134 (L) 135 - 145 mmol/L   Potassium 3.9 3.5 - 5.1 mmol/L  Chloride 91 (L) 98 - 111 mmol/L   CO2 28 22 - 32 mmol/L   Glucose, Bld 332 (H) 70 - 99 mg/dL   BUN 13 6 - 20 mg/dL   Creatinine, Ser 0.87 0.44 - 1.00 mg/dL   Calcium 9.1 8.9 - 10.3 mg/dL   Total Protein 7.9 6.5 - 8.1 g/dL   Albumin 3.8 3.5 - 5.0 g/dL   AST 16 15 - 41 U/L   ALT 15 0 - 44 U/L   Alkaline Phosphatase 62 38 - 126 U/L   Total Bilirubin 0.7 0.3 - 1.2 mg/dL   GFR, Estimated >60 >60 mL/min   Anion  gap 15 5 - 15  Lipase, blood  Result Value Ref Range   Lipase 46 11 - 51 U/L  CBC with Differential/Platelet  Result Value Ref Range   WBC 7.3 4.0 - 10.5 K/uL   RBC 5.21 (H) 3.87 - 5.11 MIL/uL   Hemoglobin 15.3 (H) 12.0 - 15.0 g/dL   HCT 45.6 36.0 - 46.0 %   MCV 87.5 80.0 - 100.0 fL   MCH 29.4 26.0 - 34.0 pg   MCHC 33.6 30.0 - 36.0 g/dL   RDW 11.6 11.5 - 15.5 %   Platelets 257 150 - 400 K/uL   nRBC 0.0 0.0 - 0.2 %   Neutrophils Relative % 28 %   Neutro Abs 2.0 1.7 - 7.7 K/uL   Lymphocytes Relative 39 %   Lymphs Abs 2.8 0.7 - 4.0 K/uL   Monocytes Relative 6 %   Monocytes Absolute 0.4 0.1 - 1.0 K/uL   Eosinophils Relative 26 %   Eosinophils Absolute 1.9 (H) 0.0 - 0.5 K/uL   Basophils Relative 1 %   Basophils Absolute 0.1 0.0 - 0.1 K/uL   Immature Granulocytes 0 %   Abs Immature Granulocytes 0.01 0.00 - 0.07 K/uL  POC CBG monitoring  Result Value Ref Range   Glucose-Capillary 408 (H) 70 - 99 mg/dL   Labs Reviewed  COMPREHENSIVE METABOLIC PANEL - Abnormal; Notable for the following components:      Result Value   Sodium 134 (*)    Chloride 91 (*)    Glucose, Bld 332 (*)    All other components within normal limits  CBC WITH DIFFERENTIAL/PLATELET - Abnormal; Notable for the following components:   RBC 5.21 (*)    Hemoglobin 15.3 (*)    Eosinophils Absolute 1.9 (*)    All other components within normal limits  CBG MONITORING, ED - Abnormal; Notable for the following components:   Glucose-Capillary 408 (*)    All other components within normal limits  LIPASE, BLOOD    No Known Allergies                                             Past Medical History:  Diagnosis Date   Diabetes mellitus without complication (HCC)    Hypertension     Social History   Socioeconomic History   Marital status: Single    Spouse name: Not on file   Number of children: Not on file   Years of education: Not on file   Highest education level: Not on file  Occupational History   Not  on file  Tobacco Use   Smoking status: Never   Smokeless tobacco: Never  Vaping Use   Vaping Use: Never used  Substance and Sexual Activity  Alcohol use: Not on file   Drug use: Not Currently   Sexual activity: Not on file  Other Topics Concern   Not on file  Social History Narrative   ** Merged History Encounter **       Social Determinants of Health   Financial Resource Strain: Not on file  Food Insecurity: Not on file  Transportation Needs: Not on file  Physical Activity: Not on file  Stress: Not on file  Social Connections: Not on file  Intimate Partner Violence: Not on file    Family History  Problem Relation Age of Onset   Diabetes Father      Vanessa Kick, MD 08/27/22 1316

## 2022-11-18 ENCOUNTER — Other Ambulatory Visit: Payer: Self-pay | Admitting: Sports Medicine

## 2022-11-18 DIAGNOSIS — E119 Type 2 diabetes mellitus without complications: Secondary | ICD-10-CM

## 2023-03-15 ENCOUNTER — Other Ambulatory Visit: Payer: Medicaid Other | Admitting: Pharmacist

## 2023-03-15 NOTE — Progress Notes (Signed)
   03/15/2023 Name: Anna Stephens MRN: [id number] DOB: 1966-01-22    Patient appearing on report for [employer] - Hypertension Control report due to last documented ambulatory blood pressure of 145/99 on 05/25/22. Next appointment with PCP is 04/06/23   [employer] interpreter ID [id number] was used for this encounter.  Outreached patient to discuss hypertension control and medication management.   Current antihypertensives: losartan-hydrochlorothiazide 100-25mg  daily  Patient does not have an automated upper arm home BP machine.  Current blood pressure readings: unable to check  Patient denies hypotensive signs and symptoms including dizziness, lightheadedness.  Patient denies hypertensive symptoms including headache, chest pain, shortness of breath.  Patient denies side effects related to medications     Assessment/Plan: - Currently unknown control, though most recent ambulatory BP documented within goal.  - - Reviewed goal blood pressure < 140/90 at minimum, ideally 130/80 - Reviewed appropriate administration of medication regimen - Counseled on long term microvascular and macrovascular complications of uncontrolled hypertension - Reviewed appropriate home BP monitoring technique (avoid caffeine, smoking, and exercise for 30 minutes before checking, rest for at least 5 minutes before taking BP, sit with feet flat on the floor and back against a hard surface, uncross legs, and rest arm on flat surface) - Discussed insurance coverage for BP monitor. Collaborated with provider to place order for BP monitor.  - Recommend continue current regimen,  sent order for BP cuff to [facility], notified DME supplier that interpreter is needed.  - Of note, patient also needs/requests home glucometer, will facilitate with PCP.

## 2023-03-19 ENCOUNTER — Other Ambulatory Visit: Payer: Self-pay

## 2023-03-19 MED ORDER — BLOOD GLUCOSE MONITORING SUPPL DEVI: 1.0000 | Freq: Three times a day (TID) | 0 refills | Status: DC

## 2023-03-19 MED ORDER — LANCET DEVICE MISC: 1.0000 | Freq: Three times a day (TID) | 0 refills | Status: AC

## 2023-03-19 MED ORDER — LANCETS MISC. MISC: 1.0000 | Freq: Three times a day (TID) | 0 refills | Status: AC

## 2023-03-19 MED ORDER — BLOOD GLUCOSE TEST VI STRP: 1.0000 | ORAL_STRIP | Freq: Three times a day (TID) | 0 refills | Status: AC

## 2023-03-19 NOTE — Progress Notes (Signed)
Glucometer is sent. Kh

## 2023-03-30 DIAGNOSIS — R7309 Other abnormal glucose: Secondary | ICD-10-CM

## 2023-03-30 LAB — GLUCOSE, POCT (MANUAL RESULT ENTRY): POC Glucose: 469 mg/dL — AB (ref 70–99)

## 2023-03-30 NOTE — Congregational Nurse Program (Addendum)
  Dept: [phone]   Congregational Nurse Program Note  Date of Encounter: 03/30/2023  Past Medical History: Past Medical History:  Diagnosis Date   Diabetes mellitus without complication (HCC)    Hypertension     Encounter Details:  Community Questionnaire - 03/30/23 1320       Questionnaire   Ask client: Do you give verbal consent for me to treat you today? Yes    Student Assistance N/A    [facility] Served  [facility]    Encounter Setting Phone/Text/Email    Population Status Migrant/Refugee    Insurance Medicaid    Insurance/Financial Assistance Referral N/A    Medication Have Medication Insecurities;Provided Medication Assistance    Medical Provider Yes    Screening Referrals Made N/A    Medical Referrals Made [facility]    Medical Appointment Completed [facility]    CNP Interventions Counsel;Advocate/Support;Educate;Navigate Healthcare System;Case Management    Screenings CN Performed Blood Pressure;Blood Glucose    ED Visit Averted Yes    Life-Saving Intervention Made N/A             Patient came in to reconnect with [facility] RN. Patient with language barrier but was able to communicate in [place]. Blood sugar and pressure elevated. BP 151/102   blood sugar 469.Upon review of medication patient is taking 4 units of 70/30 instead of 25 units.I have educated patient on proper use. She was able to dial 25 units on the pen and self inject. She has refused to be seen by a doctor today but will be back to see me tomorrow for blood sugar check. Appointment with PCP scheduled for Thursday 17th   Kaloni Bisaillon RN BSN PCCN  [facility] [phone]-cell [phone]-office

## 2023-03-30 NOTE — Addendum Note (Signed)
Addended by: Debbora Presto on: 03/30/2023 01:43 PM   Modules accepted: Orders

## 2023-03-31 ENCOUNTER — Telehealth: Payer: Self-pay

## 2023-03-31 LAB — GLUCOSE, POCT (MANUAL RESULT ENTRY): POC Glucose: 206 mg/dL — AB (ref 70–99)

## 2023-03-31 NOTE — Telephone Encounter (Signed)
Patient contacted with appointment reminder.Transportation assistance will be provided.  Nicole Cella Kourtnei Rauber RN BSN PCCN  [facility] [phone]-cell [phone]-office

## 2023-03-31 NOTE — Congregational Nurse Program (Signed)
  Dept: [phone]   Congregational Nurse Program Note  Date of Encounter: 03/31/2023  Past Medical History: Past Medical History:  Diagnosis Date   Diabetes mellitus without complication (HCC)    Hypertension     Encounter Details:  Community Questionnaire - 03/31/23 1104       Questionnaire   Ask client: Do you give verbal consent for me to treat you today? Yes    Student Assistance [employer] Nurse    Location Patient Served  [facility]    Encounter Setting CN site    Population Status Migrant/Refugee    Insurance Medicaid    Insurance/Financial Assistance Referral N/A    Medication Have Medication Insecurities;Provided Medication Assistance    Medical Provider Yes    Screening Referrals Made N/A    Medical Referrals Made N/A    Medical Appointment Completed N/A    CNP Interventions Counsel;Advocate/Support;Educate;Navigate Healthcare System;Case Management    Screenings CN Performed Blood Pressure;Blood Glucose    ED Visit Averted Yes    Life-Saving Intervention Made N/A            Pt. Presents for follow up of blood sugar and blood pressure. Blood pressure remains elevated but blood sugar has improved. Now taking insulin 70/30 as prescribed. Congregational RN will provide transportation to PCP tomorrow.   Nicole Cella Kilea Mccarey RN BSN PCCN  [facility] [phone]-cell [phone]-office

## 2023-04-01 ENCOUNTER — Other Ambulatory Visit (HOSPITAL_COMMUNITY): Payer: Self-pay

## 2023-04-01 ENCOUNTER — Ambulatory Visit: Payer: Self-pay | Admitting: Nurse Practitioner

## 2023-04-01 ENCOUNTER — Ambulatory Visit: Payer: Medicaid Other | Admitting: Pharmacist

## 2023-04-01 ENCOUNTER — Encounter: Payer: Self-pay | Admitting: Nurse Practitioner

## 2023-04-01 VITALS — BP 135/99 | HR 74 | Ht 60.5 in | Wt 225.0 lb

## 2023-04-01 DIAGNOSIS — Z794 Long term (current) use of insulin: Secondary | ICD-10-CM

## 2023-04-01 DIAGNOSIS — Z13220 Encounter for screening for lipoid disorders: Secondary | ICD-10-CM

## 2023-04-01 DIAGNOSIS — E119 Type 2 diabetes mellitus without complications: Secondary | ICD-10-CM

## 2023-04-01 DIAGNOSIS — R32 Unspecified urinary incontinence: Secondary | ICD-10-CM

## 2023-04-01 DIAGNOSIS — I10 Essential (primary) hypertension: Secondary | ICD-10-CM

## 2023-04-01 LAB — POCT URINALYSIS DIPSTICK
Bilirubin, UA: NEGATIVE
Blood, UA: NEGATIVE
Glucose, UA: POSITIVE — AB
Ketones, UA: NEGATIVE
Leukocytes, UA: NEGATIVE
Nitrite, UA: NEGATIVE
Protein, UA: NEGATIVE
Spec Grav, UA: 1.02 (ref 1.010–1.025)
Urobilinogen, UA: 0.2 U/dL
pH, UA: 6 (ref 5.0–8.0)

## 2023-04-01 LAB — POCT GLYCOSYLATED HEMOGLOBIN (HGB A1C): Hemoglobin A1C: 14.5 % — AB (ref 4.0–5.6)

## 2023-04-01 MED ORDER — LOSARTAN POTASSIUM-HCTZ 100-25 MG PO TABS
1.0000 | ORAL_TABLET | Freq: Every day | ORAL | 1 refills | Status: DC
Start: 2023-04-01 — End: 2023-06-29
  Filled 2023-04-01: qty 90, 90d supply, fill #0

## 2023-04-01 MED ORDER — NOVOLIN 70/30 FLEXPEN (70-30) 100 UNIT/ML ~~LOC~~ SUPN
PEN_INJECTOR | SUBCUTANEOUS | 0 refills | Status: DC
Start: 2023-04-01 — End: 2023-04-01

## 2023-04-01 MED ORDER — METFORMIN HCL ER 500 MG PO TB24
500.0000 mg | ORAL_TABLET | Freq: Two times a day (BID) | ORAL | 1 refills | Status: DC
Start: 2023-04-01 — End: 2023-06-29
  Filled 2023-04-01: qty 180, 90d supply, fill #0

## 2023-04-01 MED ORDER — LOSARTAN POTASSIUM-HCTZ 100-25 MG PO TABS: 1.0000 | ORAL_TABLET | Freq: Every day | ORAL | 2 refills | Status: DC

## 2023-04-01 MED ORDER — PEN NEEDLES 32G X 4 MM MISC
1 refills | Status: DC
  Filled 2023-04-01: qty 100, 30d supply, fill #0
  Filled 2023-06-29: qty 100, 30d supply, fill #1

## 2023-04-01 MED ORDER — TRULICITY 0.75 MG/0.5ML ~~LOC~~ SOAJ
0.7500 mg | SUBCUTANEOUS | 2 refills | Status: DC
  Filled 2023-04-01 (×2): qty 2, 28d supply, fill #0

## 2023-04-01 MED ORDER — METFORMIN HCL ER 500 MG PO TB24
500.0000 mg | ORAL_TABLET | Freq: Two times a day (BID) | ORAL | 2 refills | Status: DC
Start: 2023-04-01 — End: 2023-04-01

## 2023-04-01 MED ORDER — LANTUS SOLOSTAR 100 UNIT/ML ~~LOC~~ SOPN
10.0000 [IU] | PEN_INJECTOR | Freq: Every day | SUBCUTANEOUS | 99 refills | Status: DC
  Filled 2023-04-01: qty 3, 30d supply, fill #0

## 2023-04-01 NOTE — Progress Notes (Signed)
Subjective   Patient ID: Anna Stephens, female    DOB: December 02, 1965, 57 y.o.   MRN: [id number]  Chief Complaint  Patient presents with   Urinary Frequency    Referring provider: Ivonne Andrew, NP  [facility] [name] is a 57 y.o. female with Past Medical History: No date: Diabetes mellitus without complication (HCC) No date: Hypertension   HPI  Patient presents today for follow-up visit for diabetes, hypertension, hyperlipidemia.  Patient's A1c in office today is 14.5.  She does have an interpreter in the room with her through video.  Patient states that she is compliant with her medications but it does not appear that she has been taking them.  She has not been seen in our office for over a year.  Our in office pharmacist was available today to assist patient with medication management and to teach her how to correctly take her insulin.  Patient is due for labs today.  She also complains today of urinary frequency and incontinence.  We will place a referral to urology for further evaluation but we did discuss that her frequency could be a side effect from uncontrolled diabetes. Denies f/c/s, n/v/d, hemoptysis, PND, leg swelling Denies chest pain or edema         No Known Allergies  Immunization History  Administered Date(s) Administered   PNEUMOCOCCAL CONJUGATE-20 02/01/2022    Tobacco History: Social History   Tobacco Use  Smoking Status Never  Smokeless Tobacco Never   Counseling given: Not Answered   Outpatient Encounter Medications as of 04/01/2023  Medication Sig   Accu-Chek Softclix Lancets lancets Use as instructed   blood glucose meter kit and supplies KIT Dispense based on patient and insurance preference. Use up to four times daily as directed.   Blood Glucose Monitoring Suppl DEVI 1 each by Does not apply route in the morning, at noon, and at bedtime. May substitute to any manufacturer covered by patient's insurance.   Glucose Blood (BLOOD GLUCOSE TEST  STRIPS) STRP 1 each by In Vitro route in the morning, at noon, and at bedtime. May substitute to any manufacturer covered by patient's insurance.   Insulin Pen Needle (PEN NEEDLES 3/16") 31G X 5 MM MISC Use with insulin pen as directed   Lancet Device MISC 1 each by Does not apply route in the morning, at noon, and at bedtime. May substitute to any manufacturer covered by patient's insurance.   Lancets Misc. MISC 1 each by Does not apply route in the morning, at noon, and at bedtime. May substitute to any manufacturer covered by patient's insurance.   metFORMIN (GLUCOPHAGE-XR) 500 MG 24 hr tablet Take 1 tablet (500 mg total) by mouth 2 (two) times daily with a meal.   ondansetron (ZOFRAN-ODT) 4 MG disintegrating tablet Take 1 tablet (4 mg total) by mouth every 8 (eight) hours as needed for nausea or vomiting.   [DISCONTINUED] insulin isophane & regular human KwikPen (NOVOLIN 70/30 KWIKPEN) (70-30) 100 UNIT/ML KwikPen INJECT 25 UNITS SUBCUTANEOUSLY TWICE DAILY BEFORE A MEAL   insulin isophane & regular human KwikPen (NOVOLIN 70/30 KWIKPEN) (70-30) 100 UNIT/ML KwikPen INJECT 25 UNITS SUBCUTANEOUSLY TWICE DAILY BEFORE A MEAL   losartan-hydrochlorothiazide (HYZAAR) 100-25 MG tablet Take 1 tablet by mouth daily.   [DISCONTINUED] losartan-hydrochlorothiazide (HYZAAR) 100-25 MG tablet Take 1 tablet by mouth daily.   [DISCONTINUED] metFORMIN (GLUCOPHAGE) 500 MG tablet Take 1 tablet (500 mg total) by mouth 2 (two) times daily with a meal.   No facility-administered encounter medications on file  as of 04/01/2023.    Review of Systems  Review of Systems  Constitutional: Negative.   HENT: Negative.    Cardiovascular: Negative.   Gastrointestinal: Negative.   Genitourinary:  Positive for frequency.  Allergic/Immunologic: Negative.   Neurological: Negative.   Psychiatric/Behavioral: Negative.       Objective:   BP (!) 135/99 (BP Location: Right Arm, Patient Position: Sitting, Cuff Size: Large)    Pulse 74   Ht 5' 0.5" (1.537 m)   Wt 225 lb (102.1 kg)   SpO2 98%   BMI 43.22 kg/m   Wt Readings from Last 5 Encounters:  04/01/23 225 lb (102.1 kg)  04/02/22 232 lb (105.2 kg)  01/30/22 250 lb (113.4 kg)  01/28/22 217 lb 12.8 oz (98.8 kg)     Physical Exam Vitals and nursing note reviewed.  Constitutional:      General: She is not in acute distress.    Appearance: She is well-developed.  Cardiovascular:     Rate and Rhythm: Normal rate and regular rhythm.  Pulmonary:     Effort: Pulmonary effort is normal.     Breath sounds: Normal breath sounds.  Neurological:     Mental Status: She is alert and oriented to person, place, and time.       Assessment & Plan:   Type 2 diabetes mellitus without complication, with long-term current use of insulin (HCC) -     POCT glycosylated hemoglobin (Hb A1C) -     AMB Referral VBCI Care Management -     metFORMIN HCl ER; Take 1 tablet (500 mg total) by mouth 2 (two) times daily with a meal.  Dispense: 60 tablet; Refill: 2 -     NovoLIN 70/30 FlexPen; INJECT 25 UNITS SUBCUTANEOUSLY TWICE DAILY BEFORE A MEAL  Dispense: 15 mL; Refill: 0 -     CBC -     Comprehensive metabolic panel  Primary hypertension -     Losartan Potassium-HCTZ; Take 1 tablet by mouth daily.  Dispense: 30 tablet; Refill: 2 -     CBC -     Comprehensive metabolic panel  Urinary incontinence, unspecified type -     Ambulatory referral to Urology  Lipid screening -     Lipid panel     No follow-ups on file.   Ivonne Andrew, NP 04/01/2023

## 2023-04-01 NOTE — Patient Instructions (Signed)
1. Type 2 diabetes mellitus without complication, with long-term current use of insulin (HCC)  - POCT glycosylated hemoglobin (Hb A1C) - AMB Referral VBCI Care Management - CBC - Comprehensive metabolic panel - POCT urinalysis dipstick  2. Primary hypertension  - CBC - Comprehensive metabolic panel  3. Urinary incontinence, unspecified type  - Ambulatory referral to Urology - POCT urinalysis dipstick  4. Lipid screening  - Lipid Panel  Follow up:  Follow up in 3 months

## 2023-04-01 NOTE — Progress Notes (Signed)
04/01/2023 Name: Anna Stephens MRN: [id number] DOB: 07-13-1965  Chief Complaint  Patient presents with   Medication Management   Diabetes    Anna Stephens is a 57 y.o. year old female who was referred for medication management by their primary care provider, Ivonne Andrew, NP. They presented for a face to face visit today.   They were referred to the pharmacist by their PCP for assistance in managing diabetes   Interpreter Cammy Copa [phone]  Subjective:   Medication Access/Adherence  Current Pharmacy:  [facility] [address] [place] [address] Phone: [phone] Fax: [phone]   Patient reports affordability concerns with their medications: Yes  Patient reports access/transportation concerns to their pharmacy: No  Patient reports adherence concerns with their medications:  No     Diabetes:  Reports she was diagnosed ~ year ago when she immigrated to the [place].   Current medications: metformin 500 mg twice daily, Novolin 70/30 25 units twice daily - though reports she has been unable to afford lately; cannot afford $4 copay  Current glucose readings: has glucometer, has not been checking lately (picked up glucometer on Monday)  Patient reports hypoglycemic s/sx including dizziness, shakiness, sweating when she took insulin. Unclear how much she took at this time, notes today that she is illiterate with numbers.   Reports significant polyuria/polydipsia, even having urinary accidents. UA checked today and urology referral placed, but likely also side effect of hyperglycemia.   Current meal patterns:  - Lunch: noon; cornmeal muffin with yucca root; bananas; spinach (cassava leaves); lenga lenga pumpkin leaves;  - Supper: potatoes, beans;  - Drinks: notes she can sometimes afford milk, avoids soda  Objective:  Lab Results  Component Value Date   HGBA1C 14.5 (A) 04/01/2023    Lab Results  Component Value Date    CREATININE 0.87 08/26/2022   BUN 13 08/26/2022   NA 134 (L) 08/26/2022   K 3.9 08/26/2022   CL 91 (L) 08/26/2022   CO2 28 08/26/2022    No results found for: "CHOL", "HDL", "LDLCALC", "LDLDIRECT", "TRIG", "CHOLHDL"  Medications Reviewed Today     Reviewed by Alden Hipp, RPH-CPP (Pharmacist) on 04/01/23 at 1123  Med List Status: <None>   Medication Order Taking? Sig Documenting Provider Last Dose Status Informant  Accu-Chek Softclix Lancets lancets [id number] No Use as instructed Mardella Layman, MD Taking Active   blood glucose meter kit and supplies KIT [id number] No Dispense based on patient and insurance preference. Use up to four times daily as directed. Theron Arista, PA-C Taking Active   Blood Glucose Monitoring Suppl DEVI [id number] No 1 each by Does not apply route in the morning, at noon, and at bedtime. May substitute to any manufacturer covered by patient's insurance. Ivonne Andrew, NP Taking Active   Dulaglutide (TRULICITY) 0.75 MG/0.5ML SOPN [id number] Yes Inject 0.75 mg into the skin once a week. Ivonne Andrew, NP  Active   Glucose Blood (BLOOD GLUCOSE TEST STRIPS) STRP [id number] No 1 each by In Vitro route in the morning, at noon, and at bedtime. May substitute to any manufacturer covered by patient's insurance. Ivonne Andrew, NP Taking Active   insulin isophane & regular human KwikPen (NOVOLIN 70/30 KWIKPEN) (70-30) 100 UNIT/ML KwikPen [id number]  INJECT 25 UNITS SUBCUTANEOUSLY TWICE DAILY BEFORE A MEAL Ivonne Andrew, NP  Active   Insulin Pen Needle (PEN NEEDLES 3/16") 31G X 5 MM MISC [id number] No Use with insulin pen as directed Sage,  Pearletha Alfred Taking Active   Lancet Device MISC [id number] No 1 each by Does not apply route in the morning, at noon, and at bedtime. May substitute to any manufacturer covered by patient's insurance. Ivonne Andrew, NP Taking Active   Lancets Misc. MISC [id number] No 1 each by Does not apply route in the morning, at noon, and at  bedtime. May substitute to any manufacturer covered by patient's insurance. Ivonne Andrew, NP Taking Active   losartan-hydrochlorothiazide (HYZAAR) 100-25 MG tablet [id number]  Take 1 tablet by mouth daily. Ivonne Andrew, NP  Active   metFORMIN (GLUCOPHAGE-XR) 500 MG 24 hr tablet [id number]  Take 1 tablet (500 mg total) by mouth 2 (two) times daily with a meal. Ivonne Andrew, NP  Active   ondansetron (ZOFRAN-ODT) 4 MG disintegrating tablet [id number] No Take 1 tablet (4 mg total) by mouth every 8 (eight) hours as needed for nausea or vomiting. Mardella Layman, MD Taking Active               Assessment/Plan:   Diabetes: - Currently uncontrolled - Reviewed long term cardiovascular and renal outcomes of uncontrolled blood sugar - Reviewed goal A1c, goal fasting, and goal 2 hour post prandial glucose - Reviewed dietary modifications including: increasing proteins - Will assist in setting up a charge account at pharmacy. Restart metformin twice daily, switch to Lantus 10 units daily. Demonstrated how to inject insulin and dial up to 10 units, but will collaborate with CNP for continued community based support. Discussed GLP1, including mechanism of action and side effects. Start Trulicity 0.75 mg weekly. Discussed with PCP, she is in agreement. - Recommend to check glucose fasting and 2 hour post prandial, bring glucometer to next appointment    Follow Up Plan: in person follow up in 4 weeks  Catie Eppie Gibson, PharmD, BCACP, CPP Clinical Pharmacist [facility] [phone]

## 2023-04-01 NOTE — Patient Instructions (Addendum)
Anna Stephens,   It was great talking to you today!  Take the metformin twice daily. Take Trulicity pen every day. Inject 10 units of Lantus every day.   Check your blood sugars twice daily:  1) Fasting, first thing in the morning before breakfast and  2) 2 hours after your largest meal.   For a goal A1c of less than 7%, goal fasting readings are less than 130 and goal 2 hour after meal readings are less than 180.    Come back and see me in 4 weeks.   Thanks!  Catie Clearance Coots, PharmD   [name],   Ilikuwa nzuri kuzungumza na wewe leo!  [place] metformin mara mbili kwa siku. [place] kalamu ya Trulicity kila siku. Ingiza vipande 10 vya Lantus kila siku.  Angalia sukari yako ya damu mara mbili kwa siku:  1) [place]ung[place], jambo la kwanza asubuhi kabla ya kifungua kinywa na  2) masaa 2 baada ya mlo wako mkubwa zaidi.   Kwa lengo A1c la chini ya 7%, usomaji wa kufunga malengo ni chini ya 130 na lengo saa 2 baada ya usomaji wa chakula ni chini ya 180.    Rudi na kuniona baada ya wiki 4.   Asante!  Catie Clearance Coots, PharmD

## 2023-04-02 ENCOUNTER — Other Ambulatory Visit (HOSPITAL_COMMUNITY): Payer: Self-pay

## 2023-04-02 LAB — LIPID PANEL
Chol/HDL Ratio: 3.4 {ratio} (ref 0.0–4.4)
Cholesterol, Total: 170 mg/dL (ref 100–199)
HDL: 50 mg/dL (ref >39)
LDL Chol Calc (NIH): 97 mg/dL (ref 0–99)
Triglycerides: 131 mg/dL (ref 0–149)
VLDL Cholesterol Cal: 23 mg/dL (ref 5–40)

## 2023-04-02 LAB — COMPREHENSIVE METABOLIC PANEL
ALT: 24 [IU]/L (ref 0–32)
AST: 14 [IU]/L (ref 0–40)
Albumin: 3.8 g/dL (ref 3.8–4.9)
Alkaline Phosphatase: 77 [IU]/L (ref 44–121)
BUN/Creatinine Ratio: 17 (ref 9–23)
BUN: 13 mg/dL (ref 6–24)
Bilirubin Total: 0.4 mg/dL (ref 0.0–1.2)
CO2: 26 mmol/L (ref 20–29)
Calcium: 9.4 mg/dL (ref 8.7–10.2)
Chloride: 97 mmol/L (ref 96–106)
Creatinine, Ser: 0.75 mg/dL (ref 0.57–1.00)
Globulin, Total: 3.3 g/dL (ref 1.5–4.5)
Glucose: 349 mg/dL — ABNORMAL HIGH (ref 70–99)
Potassium: 4.3 mmol/L (ref 3.5–5.2)
Sodium: 136 mmol/L (ref 134–144)
Total Protein: 7.1 g/dL (ref 6.0–8.5)
eGFR: 93 mL/min/{1.73_m2} (ref >59)

## 2023-04-02 LAB — CBC
Hematocrit: 47.9 % — ABNORMAL HIGH (ref 34.0–46.6)
Hemoglobin: 15.5 g/dL (ref 11.1–15.9)
MCH: 30.5 pg (ref 26.6–33.0)
MCHC: 32.4 g/dL (ref 31.5–35.7)
MCV: 94 fL (ref 79–97)
NRBC: 1 % — ABNORMAL HIGH (ref 0–0)
Platelets: 229 10*3/uL (ref 150–450)
RBC: 5.09 x10E6/uL (ref 3.77–5.28)
RDW: 12.6 % (ref 11.7–15.4)
WBC: 5.3 10*3/uL (ref 3.4–10.8)

## 2023-04-07 ENCOUNTER — Ambulatory Visit: Payer: Self-pay | Admitting: Nurse Practitioner

## 2023-04-21 NOTE — Progress Notes (Signed)
   04/22/2023 Name: Anna Stephens MRN: [id number] DOB: May 17, 1966  Chief Complaint  Patient presents with   Medication Management   Diabetes   Hypertension    Anna Stephens is a 57 y.o. year old female who was referred for medication management by their primary care provider, Ivonne Andrew, NP. They presented for a telephone visit (originally planned for an in-person visit). Swahili interpreter used.    They were referred to the pharmacist by their PCP for assistance in managing diabetes    Subjective:  Care Team: Primary Care Provider: Ivonne Andrew, NP ; Next Scheduled Visit: 07/05/2023  Medication Access/Adherence  Current Pharmacy:  [facility] [address] [place] [address] Phone: [phone] Fax: [phone]   Diabetes:  Current medications: metformin 500 mg BID - difficult to tell if patient was taking.  - Patient reports not starting Trulicity 0.75 mg weekly or Lantus 10 units daily d/t language barrier with coordinating medications.   Current glucose readings: did not discuss d/t patient being in car with family and not being able to discuss at appointment time  Current meal patterns:  - Lunch: noon; cornmeal muffin with yucca root; bananas; spinach (cassava leaves); lenga lenga pumpkin leaves;  - Supper: potatoes, beans;  - Drinks: notes she can sometimes afford milk, avoids soda  Hyperlipidemia/ASCVD Risk Reduction   Current lipid lowering medications: none    ASCVD History: none Family History: diabetes (father) Risk Factors: DM, HTN  The 10-year ASCVD risk score (Arnett DK, et al., 2019) is: 13.5%   Values used to calculate the score:     Age: 29 years     Sex: Female     Is Non-Hispanic African American: Yes     Diabetic: Yes     Tobacco smoker: No     Systolic Blood Pressure: 135 mmHg     Is BP treated: Yes     HDL Cholesterol: 50 mg/dL     Total Cholesterol: 170 mg/dL    Objective:  Lab  Results  Component Value Date   HGBA1C 14.5 (A) 04/01/2023    Lab Results  Component Value Date   CREATININE 0.75 04/01/2023   BUN 13 04/01/2023   NA 136 04/01/2023   K 4.3 04/01/2023   CL 97 04/01/2023   CO2 26 04/01/2023    Lab Results  Component Value Date   CHOL 170 04/01/2023   HDL 50 04/01/2023   LDLCALC 97 04/01/2023   TRIG 131 04/01/2023   CHOLHDL 3.4 04/01/2023   Assessment/Plan:   Diabetes: - Currently uncontrolled, related to medication access.  - Coordinated with [facility] and [employer] to resolve billing issues - Lantus, pen needles, and Trulicity are ready for pick up. Contacted Dorothy Muhoro, CNP to schedule in person and coordinate transportation  Hyperlipidemia/ASCVD Risk Reduction: - Currently uncontrolled - last LDL 3 weeks ago: 97 (goal LDL <70) - Reviewed long term complications of uncontrolled cholesterol - Recommend moderate to high intensity statin at future appointment once diabetes medication management improved   Follow Up Plan: in person in 2 weeks  Roslyn Smiling, PharmD PGY1 Pharmacy Resident 04/22/2023 2:53 PM   Catie Eppie Gibson, PharmD, BCACP, CPP Clinical Pharmacist [facility] [phone]

## 2023-04-22 ENCOUNTER — Other Ambulatory Visit: Payer: Medicaid Other

## 2023-04-22 ENCOUNTER — Other Ambulatory Visit (HOSPITAL_COMMUNITY): Payer: Self-pay

## 2023-04-22 DIAGNOSIS — I10 Essential (primary) hypertension: Secondary | ICD-10-CM

## 2023-04-22 DIAGNOSIS — E1165 Type 2 diabetes mellitus with hyperglycemia: Secondary | ICD-10-CM

## 2023-04-27 ENCOUNTER — Other Ambulatory Visit (HOSPITAL_COMMUNITY): Payer: Self-pay

## 2023-04-28 ENCOUNTER — Telehealth: Payer: Self-pay

## 2023-04-28 NOTE — Telephone Encounter (Signed)
Patient called and informed that appointment is scheduled with the pharmacist on 05/04/23 at 0900. Patient agrees to the she scheduled date and time. Transportation assistance will be provided.  Nicole Cella Israel Werts RN BSN PCCN  [facility] [phone]-cell [phone]-office

## 2023-05-03 NOTE — Progress Notes (Unsigned)
05/04/2023 Name: Anna Stephens MRN: [id number] DOB: April 07, 1966  Chief Complaint  Patient presents with   Hypertension   Diabetes   Anna Stephens is a 57 y.o. year old female who was referred for medication management by their primary care provider, Ivonne Andrew, NP. They presented for a face to face visit today. In-person interpreter assisted today.    They were referred to the pharmacist by their PCP for assistance in managing diabetes    Subjective:  Care Team: Primary Care Provider: Ivonne Andrew, NP ; Next Scheduled Visit: 07/05/2023  Medication Access/Adherence  Current Pharmacy:  [facility] [address] [place] [address] Phone: [phone] Fax: [phone]   Diabetes:  Current medications: metformin 500 mg twice daily, Lantus 10 units daily - Reports taking Novolin 5 units twice daily, did not take today  Current glucose readings: 283 (fasting with no insulin today), unsure of home readings  Using blood glucose meter; forgot to bring meter in today   Patient denies hypoglycemic s/sx including dizziness, shakiness, sweating. Patient reports hyperglycemic symptoms including polyuria, polydipsia, polyphagia, and nocturia. Also, reports headaches that occur when dehydrated.   Hypertension:  Current medications: losartan-hydrochlorothiazide 100-25 mg daily  - Reports no missed doses in the last couple of weeks   Patient does not have a validated, automated, upper arm home BP cuff  Today, patient's blood pressure was 135/99 mmHg. She had taken BP medication this morning.   Hyperlipidemia/ASCVD Risk Reduction  Current lipid lowering medications: none  ASCVD History: none Family History: diabetes (father) Risk Factors: HTN, DM  The 10-year ASCVD risk score (Arnett DK, et al., 2019) is: 13.5%   Values used to calculate the score:     Age: 46 years     Sex: Female     Is Non-Hispanic African American: Yes      Diabetic: Yes     Tobacco smoker: No     Systolic Blood Pressure: 135 mmHg     Is BP treated: Yes     HDL Cholesterol: 50 mg/dL     Total Cholesterol: 170 mg/dL    Objective:  Lab Results  Component Value Date   HGBA1C 14.5 (A) 04/01/2023    Lab Results  Component Value Date   CREATININE 0.75 04/01/2023   BUN 13 04/01/2023   NA 136 04/01/2023   K 4.3 04/01/2023   CL 97 04/01/2023   CO2 26 04/01/2023    Lab Results  Component Value Date   CHOL 170 04/01/2023   HDL 50 04/01/2023   LDLCALC 97 04/01/2023   TRIG 131 04/01/2023   CHOLHDL 3.4 04/01/2023    Medications Reviewed Today     Reviewed by Roslyn Smiling, [facility] (Pharmacist) on 05/04/23 at 0854  Med List Status: <None>   Medication Order Taking? Sig Documenting Provider Last Dose Status Informant  Accu-Chek Softclix Lancets lancets [id number]  Use as instructed Mardella Layman, MD  Active   blood glucose meter kit and supplies KIT [id number]  Dispense based on patient and insurance preference. Use up to four times daily as directed. Theron Arista, PA-C  Active   Blood Glucose Monitoring Suppl DEVI [id number]  1 each by Does not apply route in the morning, at noon, and at bedtime. May substitute to any manufacturer covered by patient's insurance. Ivonne Andrew, NP  Active   Dulaglutide (TRULICITY) 0.75 MG/0.5ML Ivory Broad [id number]  Inject 0.75 mg into the skin once a week.  Patient not taking: Reported  on 04/22/2023   Ivonne Andrew, NP  Active   insulin glargine (LANTUS SOLOSTAR) 100 UNIT/ML Solostar Pen [id number] Yes Inject 10 Units into the skin daily. Ivonne Andrew, NP Taking Active            Med Note Cornelius Moras, Kendre Sires   Tue May 04, 2023  8:53 AM) Reports taking 5 units in the morning and 5 units at night   Insulin Pen Needle (PEN NEEDLES) 32G X 4 MM MISC [id number]  Use to inject insulin daily Ivonne Andrew, NP  Active   losartan-hydrochlorothiazide (HYZAAR) 100-25 MG tablet [id number] Yes Take 1 tablet by mouth daily.  Ivonne Andrew, NP Taking Active   metFORMIN (GLUCOPHAGE-XR) 500 MG 24 hr tablet [id number] Yes Take 1 tablet (500 mg total) by mouth 2 (two) times daily with a meal. Ivonne Andrew, NP Taking Active   ondansetron (ZOFRAN-ODT) 4 MG disintegrating tablet [id number] No Take 1 tablet (4 mg total) by mouth every 8 (eight) hours as needed for nausea or vomiting.  Patient not taking: Reported on 05/04/2023   Mardella Layman, MD Not Taking Active             Assessment/Plan:   Diabetes: - Currently uncontrolled based on last A1c of 14.5% and need for diabetes medication management  - Reviewed goal A1c, goal fasting, and goal 2 hour post prandial glucose - Recommend to begin Trulicity 0.75 mg weekly, continue taking metformin 500 mg twice daily, and begin taking Lantus 10 units once daily - Reviewed potential side effects, administration, storage and disposal requirements of GLP1 RA - Reviewed how to dial up and inject insulin. Patient completed first injection today. - Educated patient that headaches and increased urinary frequency will decrease once A1c is better controlled.  - Utilized teach back method. Patient eager and accepting during today's visit  - Recommend to check glucose twice daily (if possible) - once while fasting in the morning and once 2 hours after a meal   Hypertension: - Currently uncontrolled based on today's reading and previous office visit readings - Recommend to continue current regimen of losartan-hydrochlorothiazide 100-25 mg daily. - Considered adding amlodipine but did not want to overwhelm patient with more changes at this visit. Recommend adding if BP remains elevated at recheck.   Hyperlipidemia/ASCVD Risk Reduction: - Currently controlled based on last LDL of 97 (LDL goal <100) one month ago - Recommend to continue without therapy. Could consider adding a statin if lipid panel comes back out of goal range.   Follow Up Plan: 06/03/2023 with pharmacist  in-person  Roslyn Smiling, PharmD PGY1 Pharmacy Resident 05/04/2023 12:41 PM   I have reviewed the pharmacist's encounter and agree with their documentation.   Catie Eppie Gibson, PharmD, BCACP, CPP [facility] [phone]

## 2023-05-03 NOTE — Congregational Nurse Program (Signed)
  Dept: [phone]   Congregational Nurse Program Note  Date of Encounter: 05/03/2023  Past Medical History: Past Medical History:  Diagnosis Date   Diabetes mellitus without complication (HCC)    Hypertension     Encounter Details:  Community Questionnaire - 05/03/23 1858       Questionnaire   Ask client: Do you give verbal consent for me to treat you today? Yes    Student Assistance N/A    Location Patient Served  [facility]    Encounter Setting CN site    Population Status Migrant/Refugee    Insurance Medicaid    Insurance/Financial Assistance Referral N/A    Medication Have Medication Insecurities;Provided Medication Assistance;Referred to Medication Assistance    Medical Provider Yes    Screening Referrals Made N/A    Medical Referrals Made N/A    Medical Appointment Completed N/A    CNP Interventions Counsel;Advocate/Support;Educate;Navigate Healthcare System;Case Management    Screenings CN Performed N/A    ED Visit Averted Yes    Life-Saving Intervention Made N/A            I have contacted patient with appointment reminder.Transportation assistance will be provided  to the appointment. Patient to return home by bus. She is familiar with the bus route.  Nicole Cella Kevaughn Ewing RN BSN PCCN  [facility] [phone]-cell [phone]-office

## 2023-05-04 ENCOUNTER — Other Ambulatory Visit (HOSPITAL_COMMUNITY): Payer: Self-pay

## 2023-05-04 ENCOUNTER — Ambulatory Visit (INDEPENDENT_AMBULATORY_CARE_PROVIDER_SITE_OTHER): Payer: Medicaid Other | Admitting: Pharmacist

## 2023-05-04 DIAGNOSIS — E1165 Type 2 diabetes mellitus with hyperglycemia: Secondary | ICD-10-CM | POA: Diagnosis not present

## 2023-05-04 DIAGNOSIS — I10 Essential (primary) hypertension: Secondary | ICD-10-CM

## 2023-05-04 NOTE — Patient Instructions (Addendum)
Hello,  It was great seeing you today!  Inject Lantus (insulin) 10 units once [employer] Trulicity once weekly on Tuesdays. This medication may cause stomach upset, queasiness, or constipation, especially when first starting. This generally improves over time. Call our office if these symptoms occur and worsen, or if you have severe symptoms such as vomiting, diarrhea, or stomach pain.   Continue taking the metformin 500 mg twice daily  Continue losartan/hydrochlorothiazide once daily for blood pressure.    Thank you! I will ask Nicole Cella the nurse to help you schedule transportation for our next appointment in December.   Please bring your blood sugar machine with you that day.   Darlin Coco nzuri [place] leo!  1. [place] Lantus (insulini) vitengo 10 mara moja kwa siku 2. Anza Trulicity mara moja kila wiki siku za [place]. Dawa hii inaweza [place] usumbufu wa tumbo, kichefuchefu, au [place], haswa Ileene Musa. Hii kwa ujumla inaboresha kwa muda. Piga simu ofisini kwetu ikiwa dalili hizi zitatokea na kuwa mbaya zaidi, au ikiwa una dalili kali kama vile [place], [place], au maumivu ya tumbo.   3. Endelea kutumia metformin miligramu 500 mara mbili kila siku  4. Endelea losartan/hydrochlorothiazide mara moja kila siku kwa shinikizo la damu.    Asante! Nit[place] Dorothy muuguzi akusaidie kupanga usafiri kwa miadi yetu ijayo mnamo Desemba.   Tafadhali leta mashine yako ya sukari siku hiyo.

## 2023-05-18 NOTE — Congregational Nurse Program (Signed)
  Dept: [phone]   Congregational Nurse Program Note  Date of Encounter: 05/18/2023  Past Medical History: Past Medical History:  Diagnosis Date   Diabetes mellitus without complication (HCC)    Hypertension     Encounter Details:  Community Questionnaire - 05/18/23 0911       Questionnaire   Ask client: Do you give verbal consent for me to treat you today? Yes    Student Assistance N/A    Location Patient Served  NAI    Encounter Setting Phone/Text/Email    Population Status Migrant/Refugee    Insurance Medicaid    Insurance/Financial Assistance Referral N/A    Medication Have Medication Insecurities;Provided Medication Assistance;Referred to Medication Assistance    Medical Provider Yes    Screening Referrals Made N/A    Medical Referrals Made N/A    Medical Appointment Completed N/A    CNP Interventions Counsel;Advocate/Support;Educate;Navigate Healthcare System;Case Management    Screenings CN Performed N/A    ED Visit Averted Yes    Life-Saving Intervention Made N/A            Patient has sent me screenshots of her blood sugars at home.  05/09/23= 399  05/11/23=141  05/18/23=304  Patient asked to come to my clinic tomorrow 05/19/23 to review medication and to make sure that she is following instructions.  Nicole Cella Dallie Patton RN BSN PCCN  [facility] [phone]-cell [phone]-office

## 2023-05-24 NOTE — Congregational Nurse Program (Signed)
  Dept: [phone]   Congregational Nurse Program Note  Date of Encounter: 05/24/2023  Past Medical History: Past Medical History:  Diagnosis Date   Diabetes mellitus without complication (HCC)    Hypertension     Encounter Details:  Community Questionnaire - 05/24/23 1317       Questionnaire   Ask client: Do you give verbal consent for me to treat you today? Yes    Student Assistance N/A    Location Patient Served  NAI    Encounter Setting Phone/Text/Email    Population Status Migrant/Refugee    Insurance Medicaid    Insurance/Financial Assistance Referral N/A    Medication Have Medication Insecurities;Provided Medication Assistance;Referred to Medication Assistance    Medical Provider Yes    Screening Referrals Made N/A    Medical Referrals Made N/A    Medical Appointment Completed N/A    CNP Interventions Counsel;Advocate/Support;Educate;Navigate Healthcare System;Case Management    Screenings CN Performed N/A    ED Visit Averted Yes    Life-Saving Intervention Made N/A            patient sent me a screen shot blood sugar reading = 351. She would like to reschedule her appointment with the pharmacist due conflicts with her daughter`s medical appointment. Message sent to pharmacist.   Arman Bogus RN BSN PCCN  [facility] [phone] [phone]-office

## 2023-05-26 NOTE — Progress Notes (Signed)
This encounter was created in error - please disregard.

## 2023-05-27 NOTE — Congregational Nurse Program (Signed)
  Dept: [phone]   Congregational Nurse Program Note  Date of Encounter: 05/27/2023  Past Medical History: Past Medical History:  Diagnosis Date   Diabetes mellitus without complication (HCC)    Hypertension     Encounter Details:  Community Questionnaire - 05/27/23 1816       Questionnaire   Ask client: Do you give verbal consent for me to treat you today? Yes    Student Assistance N/A    Location Patient Served  NAI    Encounter Setting Phone/Text/Email    Population Status Migrant/Refugee    Insurance Medicaid    Insurance/Financial Assistance Referral N/A    Medication Have Medication Insecurities;Provided Medication Assistance;Referred to Medication Assistance    Medical Provider Yes    Screening Referrals Made N/A    Medical Referrals Made N/A    Medical Appointment Completed N/A    CNP Interventions Counsel;Advocate/Support;Educate;Navigate Healthcare System;Case Management    Screenings CN Performed N/A    ED Visit Averted Yes    Life-Saving Intervention Made N/A             Patient sent me a screenshot of her blood sugars checks at home   05/09/23= 399   05/11/23=141   05/18/23=304  05/24/23=351  05/27/23=229   Nicole Cella Celinda Dethlefs RN BSN PCCN  [facility] [phone] [phone]-office

## 2023-05-28 ENCOUNTER — Telehealth: Payer: Self-pay

## 2023-05-28 NOTE — Telephone Encounter (Signed)
Patient appointment rescheduled per her request.  Arman Bogus RN BSN PCCN  [facility] [phone] [phone]-office

## 2023-06-03 ENCOUNTER — Ambulatory Visit: Payer: Self-pay

## 2023-06-17 ENCOUNTER — Ambulatory Visit: Payer: Self-pay

## 2023-06-29 ENCOUNTER — Other Ambulatory Visit (HOSPITAL_COMMUNITY): Payer: Self-pay

## 2023-06-29 ENCOUNTER — Ambulatory Visit (INDEPENDENT_AMBULATORY_CARE_PROVIDER_SITE_OTHER): Payer: Medicaid Other | Admitting: Pharmacist

## 2023-06-29 ENCOUNTER — Ambulatory Visit: Payer: Self-pay

## 2023-06-29 DIAGNOSIS — Z794 Long term (current) use of insulin: Secondary | ICD-10-CM

## 2023-06-29 DIAGNOSIS — I10 Essential (primary) hypertension: Secondary | ICD-10-CM

## 2023-06-29 DIAGNOSIS — E119 Type 2 diabetes mellitus without complications: Secondary | ICD-10-CM

## 2023-06-29 MED ORDER — GLUCOSE BLOOD VI STRP
ORAL_STRIP | 12 refills | Status: DC
  Filled 2023-06-29: qty 100, fill #0

## 2023-06-29 MED ORDER — ACCU-CHEK SOFTCLIX LANCETS MISC
3 refills | Status: DC
  Filled 2023-06-29: qty 100, 30d supply, fill #0
  Filled 2023-09-14: qty 100, 30d supply, fill #1
  Filled 2023-12-24: qty 100, 30d supply, fill #2

## 2023-06-29 MED ORDER — ACCU-CHEK GUIDE W/DEVICE KIT
PACK | 0 refills | Status: AC
  Filled 2023-06-29: qty 1, 28d supply, fill #0

## 2023-06-29 MED ORDER — METFORMIN HCL ER 500 MG PO TB24
500.0000 mg | ORAL_TABLET | Freq: Two times a day (BID) | ORAL | 1 refills | Status: DC
  Filled 2023-06-29: qty 60, 30d supply, fill #0
  Filled 2023-06-29: qty 180, 90d supply, fill #0
  Filled 2023-09-14: qty 60, 30d supply, fill #1
  Filled 2023-10-25: qty 60, 30d supply, fill #2
  Filled 2023-11-17: qty 60, 30d supply, fill #3
  Filled 2023-12-24: qty 60, 30d supply, fill #4

## 2023-06-29 MED ORDER — BLOOD GLUCOSE TEST VI STRP
ORAL_STRIP | 1 refills | Status: DC
  Filled 2023-06-29: qty 50, 25d supply, fill #0
  Filled 2023-09-14: qty 50, 25d supply, fill #1
  Filled 2023-12-24: qty 50, 25d supply, fill #2

## 2023-06-29 MED ORDER — LANCET DEVICE MISC
0 refills | Status: AC
  Filled 2023-06-29: qty 1, fill #0

## 2023-06-29 MED ORDER — TRULICITY 0.75 MG/0.5ML ~~LOC~~ SOAJ
0.7500 mg | SUBCUTANEOUS | 2 refills | Status: DC
  Filled 2023-06-29 (×2): qty 2, 28d supply, fill #0

## 2023-06-29 MED ORDER — LANTUS SOLOSTAR 100 UNIT/ML ~~LOC~~ SOPN
16.0000 [IU] | PEN_INJECTOR | Freq: Every day | SUBCUTANEOUS | 99 refills | Status: DC
  Filled 2023-06-29 (×2): qty 15, 93d supply, fill #0
  Filled 2023-09-14: qty 15, 93d supply, fill #1
  Filled 2023-12-24: qty 15, 93d supply, fill #2

## 2023-06-29 MED ORDER — LOSARTAN POTASSIUM-HCTZ 100-25 MG PO TABS
1.0000 | ORAL_TABLET | Freq: Every day | ORAL | 1 refills | Status: DC
  Filled 2023-06-29: qty 90, 90d supply, fill #0
  Filled 2023-06-29: qty 30, 30d supply, fill #0
  Filled 2023-09-14: qty 30, 30d supply, fill #1
  Filled 2023-10-25: qty 30, 30d supply, fill #2
  Filled 2023-11-17: qty 30, 30d supply, fill #3
  Filled 2023-12-24: qty 30, 30d supply, fill #4

## 2023-06-29 NOTE — Patient Instructions (Addendum)
 [place] dawa zako, tafadhali muulize mtu akusaidie kupiga simu kwa duka la dawa. [place] kukutumia barua nyumbani kwako.   Duka la [address] [place] - [phone]  Endelea na metformin  mara mbili kwa siku, Trulicity  mara moja kwa wiki, na uongeze Lantus  hadi vitengo 16 mara moja kila siku kwa sukari yako ya damu.   Endelea losartan /hydrochlorothiazide  mara moja kila siku kwa shinikizo la damu yako.  When you run out of your medications, please ask someone to help you call the pharmacy. They can mail them to you at your house.   [facility] - [phone]  Continue metformin  twice daily, Trulicity  once weekly, and increase Lantus  to 16 units once daily for your blood sugars.   Continue losartan /hydrochlorothiazide  once daily for your blood pressure.

## 2023-06-29 NOTE — Progress Notes (Signed)
 06/29/2023 Name: Anna Stephens MRN: [id number] DOB: January 26, 1966  Chief Complaint  Patient presents with   Medication Management   Diabetes    Anna Stephens is a 58 y.o. year old female who presented for a telephone visit.   They were referred to the pharmacist by their PCP for assistance in managing diabetes.   Spoke with patient with translator services.   Subjective:  Care Team: Primary Care Provider: [name], NP ; Next Scheduled Visit: 07/05/23  Medication Access/Adherence  Current Pharmacy:  [facility] - [facility] [address] [place] [address] Phone: [phone] Fax: [phone]   Patient reports affordability concerns with their medications: No  Patient reports access/transportation concerns to their pharmacy: Yes  Patient reports adherence concerns with their medications:  Yes    Notes she ran out of medications 3 days ago. Based on refill/day supply, she should still have Lantus  on hand.    Diabetes:  Current medications: Trulicity  0.75 mg weekly, metformin  XR 500 mg twice daily, Lantus  10 units daily - notes she ran out of all of her medications a few days ago.   Confirms appropriate dosing of Trulicity  and Lantus  by using demo pens. Patient notes she has not been checking blood sugars because her device does not work. Notes she had been feeling better until she ran out of medications  Hypertension:  Current medications: prescribed losartan /hydrochlorothiazide , ran out a few days ago  Objective:  Lab Results  Component Value Date   HGBA1C 14.5 (A) 04/01/2023    Lab Results  Component Value Date   CREATININE 0.75 04/01/2023   BUN 13 04/01/2023   NA 136 04/01/2023   K 4.3 04/01/2023   CL 97 04/01/2023   CO2 26 04/01/2023    Lab Results  Component Value Date   CHOL 170 04/01/2023   HDL 50 04/01/2023   LDLCALC 97 04/01/2023   TRIG 131 04/01/2023   CHOLHDL 3.4 04/01/2023    Medications Reviewed  Today     Reviewed by Rudy Dorothyann DASEN, RPH-CPP (Pharmacist) on 06/29/23 at 1017  Med List Status: <None>   Medication Order Taking? Sig Documenting Provider Last Dose Status Informant  Accu-Chek Softclix Lancets lancets [id number] No Use as instructed Rolinda Rogue, MD Taking Active   Accu-Chek Softclix Lancets lancets [id number] Yes Use to check blood sugar twice daily. [name], NP  Active   Discontinued 06/29/23 0920   Blood Glucose Monitoring Suppl (ACCU-CHEK GUIDE) w/Device KIT [id number] Yes Use to check blood sugar twice daily. [name], NP  Active   Discontinued 06/29/23 0920   Dulaglutide  (TRULICITY ) 0.75 MG/0.5ML SOAJ [id number]  Inject 0.75 mg into the skin once a week. [name], NP  Active   Glucose Blood (BLOOD GLUCOSE TEST STRIPS) STRP [id number] Yes Use to check blood sugar twice daily. [name], NP  Active    Discontinued 06/29/23 9077   insulin  glargine (LANTUS  SOLOSTAR) 100 UNIT/ML Solostar Pen [id number]  Inject 16 Units into the skin daily. [name], NP  Active   Insulin  Pen Needle (PEN NEEDLES) 32G X 4 MM MISC [id number]  Use to inject insulin  daily Nichols, Tonya S, NP  Active   Lancet Device MISC [id number] Yes Use to check blood sugar twice daily. May substitute to any manufacturer covered by patient's insurance. [name], NP  Active   losartan -hydrochlorothiazide  (HYZAAR ) 100-25 MG tablet [id number]  Take 1 tablet by mouth daily. [name], NP  Active  metFORMIN  (GLUCOPHAGE -XR) 500 MG 24 hr tablet [id number]  Take 1 tablet (500 mg total) by mouth 2 (two) times daily with a meal. [name], NP  Active   ondansetron  (ZOFRAN -ODT) 4 MG disintegrating tablet [id number] No Take 1 tablet (4 mg total) by mouth every 8 (eight) hours as needed for nausea or vomiting.  Patient not taking: Reported on 05/04/2023   Rolinda Rogue, MD Not Taking Active               Assessment/Plan:   Assisted in setting patient up for  mail order. [facility] RN, [name], will help patient call in refills moving forward.   Diabetes: - Currently uncontrolled - Collaborated with pharmacy to refill Trulicity , metformin . Recommend to increase Lantus  to 16 units daily. - Recommend to check glucose periodically, document, and provide at future appointments.    Hypertension: - Currently uncontrolled due to medication access - Recommend to fill and continue lisinopril/HCTZ    Follow Up Plan: PCP visit next week  Cat[facility], PharmD, BCACP, CPP Clinical Pharmacist [facility] [phone]

## 2023-07-02 ENCOUNTER — Telehealth: Payer: Self-pay

## 2023-07-05 ENCOUNTER — Ambulatory Visit (INDEPENDENT_AMBULATORY_CARE_PROVIDER_SITE_OTHER): Payer: Medicaid Other | Admitting: Nurse Practitioner

## 2023-07-05 ENCOUNTER — Telehealth: Payer: Self-pay

## 2023-07-05 ENCOUNTER — Ambulatory Visit: Payer: Self-pay | Admitting: Nurse Practitioner

## 2023-07-05 ENCOUNTER — Encounter: Payer: Self-pay | Admitting: Nurse Practitioner

## 2023-07-05 VITALS — BP 150/90 | HR 77 | Temp 97.2°F | Wt 244.6 lb

## 2023-07-05 DIAGNOSIS — Z794 Long term (current) use of insulin: Secondary | ICD-10-CM

## 2023-07-05 DIAGNOSIS — E119 Type 2 diabetes mellitus without complications: Secondary | ICD-10-CM | POA: Diagnosis not present

## 2023-07-05 LAB — POCT GLYCOSYLATED HEMOGLOBIN (HGB A1C): Hemoglobin A1C: 10.4 % — AB (ref 4.0–5.6)

## 2023-07-05 NOTE — Telephone Encounter (Signed)
I have called patient with appointment reminder, no answer. Voice message left in Swahili using whatsapp.  Nicole Cella Zaron Zwiefelhofer RN BSN PCCN  [facility] [phone]-cell [phone]-office

## 2023-07-05 NOTE — Progress Notes (Signed)
Subjective   Patient ID: Anna Stephens, female    DOB: Oct 18, 1965, 58 y.o.   MRN: [id number]  Chief Complaint  Patient presents with   Diabetes    Follow up    Referring provider: Ivonne Andrew, NP  [facility] is a 58 y.o. female with Past Medical History: No date: Diabetes mellitus without complication (HCC) No date: Hypertension   HPI  Patient presents today for follow-up visit for diabetes, hypertension, hyperlipidemia.  Patient's A1c in office today is 10.4 which has improved from 14.5 at last visit.  She does have an interpreter in the room with her through video.  Patient states that she is compliant with her medications. Pharmacy is following for medication management. Patient is due for labs today. Blood pressure is slightly elevated in office today. Will continue to monitor. Denies f/c/s, n/v/d, hemoptysis, PND, leg swelling Denies chest pain or edema.      No Known Allergies  Immunization History  Administered Date(s) Administered   PNEUMOCOCCAL CONJUGATE-20 02/01/2022    Tobacco History: Social History   Tobacco Use  Smoking Status Never  Smokeless Tobacco Never   Counseling given: Not Answered   Outpatient Encounter Medications as of 07/05/2023  Medication Sig   Accu-Chek Softclix Lancets lancets Use as instructed   Accu-Chek Softclix Lancets lancets Use to check blood sugar twice daily.   Blood Glucose Monitoring Suppl (ACCU-CHEK GUIDE) w/Device KIT Use to check blood sugar twice daily.   Dulaglutide (TRULICITY) 0.75 MG/0.5ML SOAJ Inject 0.75 mg into the skin once a week.   Glucose Blood (BLOOD GLUCOSE TEST STRIPS) STRP Use to check blood sugar twice daily.   insulin glargine (LANTUS SOLOSTAR) 100 UNIT/ML Solostar Pen Inject 16 Units into the skin daily.   Insulin Pen Needle (PEN NEEDLES) 32G X 4 MM MISC Use to inject insulin daily   Lancet Device MISC Use to check blood sugar twice daily. May substitute to any manufacturer covered by  patient's insurance.   losartan-hydrochlorothiazide (HYZAAR) 100-25 MG tablet Take 1 tablet by mouth daily.   metFORMIN (GLUCOPHAGE-XR) 500 MG 24 hr tablet Take 1 tablet (500 mg total) by mouth 2 (two) times daily with a meal.   ondansetron (ZOFRAN-ODT) 4 MG disintegrating tablet Take 1 tablet (4 mg total) by mouth every 8 (eight) hours as needed for nausea or vomiting.   No facility-administered encounter medications on file as of 07/05/2023.    Review of Systems  Review of Systems  Constitutional: Negative.   HENT: Negative.    Cardiovascular: Negative.   Gastrointestinal: Negative.   Allergic/Immunologic: Negative.   Neurological: Negative.   Psychiatric/Behavioral: Negative.       Objective:   BP (!) 150/90   Pulse 77   Temp (!) 97.2 F (36.2 C)   Wt 244 lb 9.6 oz (110.9 kg)   SpO2 96%   BMI 46.98 kg/m   Wt Readings from Last 5 Encounters:  07/05/23 244 lb 9.6 oz (110.9 kg)  04/01/23 225 lb (102.1 kg)  04/02/22 232 lb (105.2 kg)  01/30/22 250 lb (113.4 kg)  01/28/22 217 lb 12.8 oz (98.8 kg)     Physical Exam Vitals and nursing note reviewed.  Constitutional:      General: She is not in acute distress.    Appearance: She is well-developed.  Cardiovascular:     Rate and Rhythm: Normal rate and regular rhythm.  Pulmonary:     Effort: Pulmonary effort is normal.     Breath sounds: Normal breath sounds.  Neurological:     Mental Status: She is alert and oriented to person, place, and time.       Assessment & Plan:   Type 2 diabetes mellitus without complication, with long-term current use of insulin (HCC) -     POCT glycosylated hemoglobin (Hb A1C) -     CBC -     Comprehensive metabolic panel     Return in about 3 months (around 10/03/2023).   Ivonne Andrew, NP 07/05/2023

## 2023-07-06 LAB — CBC
Hematocrit: 42.5 % (ref 34.0–46.6)
Hemoglobin: 13.8 g/dL (ref 11.1–15.9)
MCH: 29.7 pg (ref 26.6–33.0)
MCHC: 32.5 g/dL (ref 31.5–35.7)
MCV: 91 fL (ref 79–97)
Platelets: 205 10*3/uL (ref 150–450)
RBC: 4.65 x10E6/uL (ref 3.77–5.28)
RDW: 11.9 % (ref 11.7–15.4)
WBC: 4.4 10*3/uL (ref 3.4–10.8)

## 2023-07-06 LAB — COMPREHENSIVE METABOLIC PANEL
ALT: 19 [IU]/L (ref 0–32)
AST: 10 [IU]/L (ref 0–40)
Albumin: 3.7 g/dL — ABNORMAL LOW (ref 3.8–4.9)
Alkaline Phosphatase: 72 [IU]/L (ref 44–121)
BUN/Creatinine Ratio: 19 (ref 9–23)
BUN: 11 mg/dL (ref 6–24)
Bilirubin Total: 0.3 mg/dL (ref 0.0–1.2)
CO2: 24 mmol/L (ref 20–29)
Calcium: 8.5 mg/dL — ABNORMAL LOW (ref 8.7–10.2)
Chloride: 104 mmol/L (ref 96–106)
Creatinine, Ser: 0.57 mg/dL (ref 0.57–1.00)
Globulin, Total: 3.1 g/dL (ref 1.5–4.5)
Glucose: 309 mg/dL — ABNORMAL HIGH (ref 70–99)
Potassium: 4.2 mmol/L (ref 3.5–5.2)
Sodium: 139 mmol/L (ref 134–144)
Total Protein: 6.8 g/dL (ref 6.0–8.5)
eGFR: 106 mL/min/{1.73_m2} (ref >59)

## 2023-07-06 NOTE — Telephone Encounter (Signed)
Sent to  [place] .

## 2023-07-07 ENCOUNTER — Other Ambulatory Visit (HOSPITAL_COMMUNITY): Payer: Self-pay

## 2023-07-07 ENCOUNTER — Other Ambulatory Visit: Payer: Self-pay | Admitting: Nurse Practitioner

## 2023-07-07 DIAGNOSIS — E1165 Type 2 diabetes mellitus with hyperglycemia: Secondary | ICD-10-CM

## 2023-07-07 MED ORDER — AMLODIPINE BESYLATE 5 MG PO TABS
5.0000 mg | ORAL_TABLET | Freq: Every day | ORAL | 11 refills | Status: DC
  Filled 2023-07-07 – 2023-07-08 (×2): qty 30, 30d supply, fill #0
  Filled 2023-09-14: qty 30, 30d supply, fill #1
  Filled 2023-10-25: qty 30, 30d supply, fill #2
  Filled 2023-11-17: qty 30, 30d supply, fill #3
  Filled 2023-12-24: qty 30, 30d supply, fill #4

## 2023-07-07 MED ORDER — TRULICITY 1.5 MG/0.5ML ~~LOC~~ SOAJ
1.5000 mg | SUBCUTANEOUS | 2 refills | Status: DC
  Filled 2023-07-07 – 2023-07-08 (×2): qty 2, 28d supply, fill #0
  Filled 2023-09-14: qty 2, 28d supply, fill #1
  Filled 2023-10-25: qty 2, 28d supply, fill #2

## 2023-07-08 ENCOUNTER — Other Ambulatory Visit (HOSPITAL_BASED_OUTPATIENT_CLINIC_OR_DEPARTMENT_OTHER): Payer: Self-pay

## 2023-07-08 ENCOUNTER — Other Ambulatory Visit (HOSPITAL_COMMUNITY): Payer: Self-pay

## 2023-07-08 NOTE — Congregational Nurse Program (Signed)
  Dept: [phone]   Congregational Nurse Program Note  Date of Encounter: 07/08/2023  Past Medical History: Past Medical History:  Diagnosis Date   Diabetes mellitus without complication (HCC)    Hypertension     Encounter Details:  Community Questionnaire - 07/08/23 1659       Questionnaire   Ask client: Do you give verbal consent for me to treat you today? Yes    Student Assistance N/A    Location Patient Served  NAI    Encounter Setting Phone/Text/Email    Population Status Migrant/Refugee    Insurance Medicaid    Insurance/Financial Assistance Referral N/A    Medication Have Medication Insecurities;Provided Medication Assistance;Referred to Medication Assistance;Patient Medications Reviewed    Screening Referrals Made N/A    Medical Referrals Made N/A    Medical Appointment Completed [facility]    CNP Interventions Counsel;Advocate/Support;Educate;Navigate Healthcare System;Case Management    Screenings CN Performed N/A    ED Visit Averted Yes    Life-Saving Intervention Made N/A           Patient has transportation limitations. Medication Trulicity and Amlodipine set up for home delivery. Expected to  be delivered on Tuesday. Patient sent me a screen shot of her blood sugar reading 228.  Blood Sugar 07/08/23 = 228  Bayard More RN BSN PCCN  [facility] [phone]-cell [phone]-office

## 2023-07-08 NOTE — Progress Notes (Signed)
 This encounter was created in error - please disregard.

## 2023-07-09 ENCOUNTER — Other Ambulatory Visit: Payer: Self-pay

## 2023-07-12 ENCOUNTER — Other Ambulatory Visit: Payer: Self-pay | Admitting: Nurse Practitioner

## 2023-07-12 DIAGNOSIS — Z1231 Encounter for screening mammogram for malignant neoplasm of breast: Secondary | ICD-10-CM

## 2023-07-16 ENCOUNTER — Other Ambulatory Visit (HOSPITAL_COMMUNITY): Payer: Self-pay

## 2023-07-17 NOTE — Congregational Nurse Program (Signed)
  Dept: [phone]   Congregational Nurse Program Note  Date of Encounter: 07/17/2023  Past Medical History: Past Medical History:  Diagnosis Date   Diabetes mellitus without complication (HCC)    Hypertension     Encounter Details:  Community Questionnaire - 07/17/23 0809       Questionnaire   Ask client: Do you give verbal consent for me to treat you today? Yes    Student Assistance N/A    Location Patient Served  NAI    Encounter Setting Phone/Text/Email    Population Status Migrant/Refugee    Insurance Medicaid    Insurance/Financial Assistance Referral N/A    Medication Have Medication Insecurities;Provided Medication Assistance;Referred to Medication Assistance;Patient Medications Reviewed    Medical Provider Yes    Screening Referrals Made N/A    Medical Referrals Made N/A    Medical Appointment Completed [facility]    CNP Interventions Counsel;Advocate/Support;Educate;Navigate Healthcare System;Case Management    Screenings CN Performed N/A    ED Visit Averted Yes    Life-Saving Intervention Made N/A            Patient has sent me her blood sugar readings from home via a photo  Blood Sugar 07/08/23 = 228 Blood sugar 07/11/23 = 234 Blood Sugar 07/15/23 = 208  Reesha Debes RN BSN PCCN  [facility] [phone]-cell [phone]-office

## 2023-07-21 ENCOUNTER — Inpatient Hospital Stay: Admission: RE | Admit: 2023-07-21 | Payer: Medicaid Other | Source: Ambulatory Visit

## 2023-09-14 ENCOUNTER — Other Ambulatory Visit (HOSPITAL_COMMUNITY): Payer: Self-pay

## 2023-09-14 ENCOUNTER — Other Ambulatory Visit: Payer: Self-pay

## 2023-09-14 NOTE — Congregational Nurse Program (Signed)
  Dept: [phone]   Congregational Nurse Program Note  Date of Encounter: 09/14/2023  Past Medical History: Past Medical History:  Diagnosis Date   Diabetes mellitus without complication (HCC)    Hypertension     Encounter Details:  Community Questionnaire - 09/14/23 1243       Questionnaire   Ask client: Do you give verbal consent for me to treat you today? Yes    Student Assistance N/A    Location Patient Served  NAI    Encounter Setting Phone/Text/Email    Population Status Migrant/Refugee    Insurance Medicaid    Insurance/Financial Assistance Referral N/A    Medication Have Medication Insecurities;Provided Medication Assistance;Referred to Medication Assistance;Patient Medications Reviewed    Medical Provider Yes    Screening Referrals Made N/A    Medical Referrals Made N/A    Medical Appointment Completed [facility]    CNP Interventions Counsel;Advocate/Support;Educate;Navigate Healthcare System;Case Management    Screenings CN Performed N/A    ED Visit Averted Yes    Life-Saving Intervention Made N/A           Patient has run of injectable blood sugar medications. She has not injected  for about a week. Home blood sugar reading today 233. I have called pharmacy to set up for medication delivery by mail. Patient educated on compliance   Nicole Cella Scotti Motter RN BSN PCCN  [facility] [phone]-cell [phone]-office

## 2023-09-22 ENCOUNTER — Ambulatory Visit: Payer: Self-pay

## 2023-09-22 NOTE — Telephone Encounter (Signed)
 Message from Jacona E sent at 09/22/2023 10:34 AM EDT  Summary: Not feeling well, no appt available soon enough   Copied From CRM [id number]. Reason for Triage: Pt is not feeling well, has not been feeling well for 2 weeks.  Weak, headaches, she has been taking her medications as prescribed.  Taneka from [employer], please follow up with Taneka Best contact: [phone]         No apt available; instructed to go to UC.  Patient not on phone; discussed with Taneka from [employer].

## 2023-09-30 ENCOUNTER — Telehealth: Payer: Self-pay

## 2023-09-30 NOTE — Telephone Encounter (Signed)
 Copied from CRM [id number]. Topic: Medical Record Request - Payor/Billing Request >> Sep 30, 2023  1:58 PM Ivette P wrote: Reason for CRM: Voncille [place] called in about pt being charged for appt on Monday 04/21. Tameka would like to know if there will be a charge if so would need cancel.   Tameka requesting callback [phone]

## 2023-10-04 ENCOUNTER — Ambulatory Visit (INDEPENDENT_AMBULATORY_CARE_PROVIDER_SITE_OTHER): Payer: Self-pay | Admitting: Nurse Practitioner

## 2023-10-04 ENCOUNTER — Encounter: Payer: Self-pay | Admitting: Nurse Practitioner

## 2023-10-04 DIAGNOSIS — E119 Type 2 diabetes mellitus without complications: Secondary | ICD-10-CM | POA: Diagnosis not present

## 2023-10-04 DIAGNOSIS — Z794 Long term (current) use of insulin: Secondary | ICD-10-CM

## 2023-10-04 LAB — POCT GLYCOSYLATED HEMOGLOBIN (HGB A1C): Hemoglobin A1C: 9.7 % — AB (ref 4.0–5.6)

## 2023-10-04 NOTE — Progress Notes (Signed)
 Subjective   Patient ID: Anna Stephens, female    DOB: [birth year], 58 y.o.   MRN: [id number]  No chief complaint on file.   Referring provider: Jerrlyn Morel, NP  Anna Stephens is a 58 y.o. female with Past Medical History: No date: Diabetes mellitus without complication (HCC) No date: Hypertension   HPI  Patient presents today for follow-up visit for diabetes, hypertension, hyperlipidemia.  Patient's A1c in office today is 9.7.  She does have an interpreter in the room with her.  Patient states that she is compliant with her medications. Pharmacy is following for medication management. Will place a referral to endocrinology.  Denies f/c/s, n/v/d, hemoptysis, PND, leg swelling Denies chest pain or edema.   No Known Allergies  Immunization History  Administered Date(s) Administered   PNEUMOCOCCAL CONJUGATE-20 02/01/2022    Tobacco History: Social History   Tobacco Use  Smoking Status Never  Smokeless Tobacco Never   Counseling given: Not Answered   Outpatient Encounter Medications as of 10/04/2023  Medication Sig   Accu-Chek Softclix Lancets lancets Use as instructed   Accu-Chek Softclix Lancets lancets Use to check blood sugar twice daily.   amLODipine  (NORVASC ) 5 MG tablet Take 1 tablet (5 mg total) by mouth daily.   Blood Glucose Monitoring Suppl (ACCU-CHEK GUIDE) w/Device KIT Use to check blood sugar twice daily.   Dulaglutide  (TRULICITY ) 1.5 MG/0.5ML SOAJ Inject 1.5 mg into the skin once a week.   Glucose Blood (BLOOD GLUCOSE TEST STRIPS) STRP Use to check blood sugar twice daily.   insulin  glargine (LANTUS  SOLOSTAR) 100 UNIT/ML Solostar Pen Inject 16 Units into the skin daily.   Insulin  Pen Needle (PEN NEEDLES) 32G X 4 MM MISC Use to inject insulin  daily   Lancet Device MISC Use to check blood sugar twice daily. May substitute to any manufacturer covered by patient's insurance.   losartan -hydrochlorothiazide  (HYZAAR ) 100-25 MG tablet Take 1 tablet by mouth  daily.   metFORMIN  (GLUCOPHAGE -XR) 500 MG 24 hr tablet Take 1 tablet (500 mg total) by mouth 2 (two) times daily with a meal.   ondansetron  (ZOFRAN -ODT) 4 MG disintegrating tablet Take 1 tablet (4 mg total) by mouth every 8 (eight) hours as needed for nausea or vomiting.   No facility-administered encounter medications on file as of 10/04/2023.    Review of Systems  Review of Systems  Constitutional: Negative.   HENT: Negative.    Cardiovascular: Negative.   Gastrointestinal: Negative.   Allergic/Immunologic: Negative.   Neurological: Negative.   Psychiatric/Behavioral: Negative.       Objective:   BP (!) 156/104   Pulse 82   Temp 98.1 F (36.7 C) (Oral)   Wt 242 lb 3.2 oz (109.9 kg)   SpO2 98%   BMI 46.52 kg/m   Wt Readings from Last 5 Encounters:  10/04/23 242 lb 3.2 oz (109.9 kg)  07/05/23 244 lb 9.6 oz (110.9 kg)  04/01/23 225 lb (102.1 kg)  04/02/22 232 lb (105.2 kg)  01/30/22 250 lb (113.4 kg)     Physical Exam Vitals and nursing note reviewed.  Constitutional:      General: She is not in acute distress.    Appearance: She is well-developed.  Cardiovascular:     Rate and Rhythm: Normal rate and regular rhythm.  Pulmonary:     Effort: Pulmonary effort is normal.     Breath sounds: Normal breath sounds.  Neurological:     Mental Status: She is alert and oriented to person, place, and time.  Assessment & Plan:   Type 2 diabetes mellitus without complication, with long-term current use of insulin  (HCC) -     Microalbumin / creatinine urine ratio -     POCT glycosylated hemoglobin (Hb A1C) -     Ambulatory referral to Endocrinology     Return in about 3 months (around 01/03/2024).   Jerrlyn Morel, NP 10/04/2023

## 2023-10-04 NOTE — Patient Instructions (Signed)
 1. Type 2 diabetes mellitus without complication, with long-term current use of insulin  (HCC)  - Urine Albumin/Creatinine with ratio (send out) [LAB689] - POCT glycosylated hemoglobin (Hb A1C) - Ambulatory referral to Endocrinology

## 2023-10-06 LAB — MICROALBUMIN / CREATININE URINE RATIO
Creatinine, Urine: 117.7 mg/dL
Microalb/Creat Ratio: 9 mg/g{creat} (ref 0–29)
Microalbumin, Urine: 10.3 ug/mL

## 2023-10-22 IMAGING — CT CT ANGIO CHEST
2 of 6 series · 18 of 36 positions shown · IV contrast (agent unspecified)
Comparison: Radiograph earlier today.

CLINICAL DATA: Pulmonary embolism (PE) suspected, positive D-dimer

Chest pain.
EXAM:
CT ANGIOGRAPHY CHEST WITH CONTRAST
TECHNIQUE: Multidetector CT imaging of the chest was performed using the
standard protocol during bolus administration of intravenous
contrast. Multiplanar CT image reconstructions and MIPs were
obtained to evaluate the vascular anatomy.

[Series 7: pe thins · axial · 0.83mm/px · z∈[+1138,+1358]mm · 17 of 350 slices shown]
[im 18/350  lung]
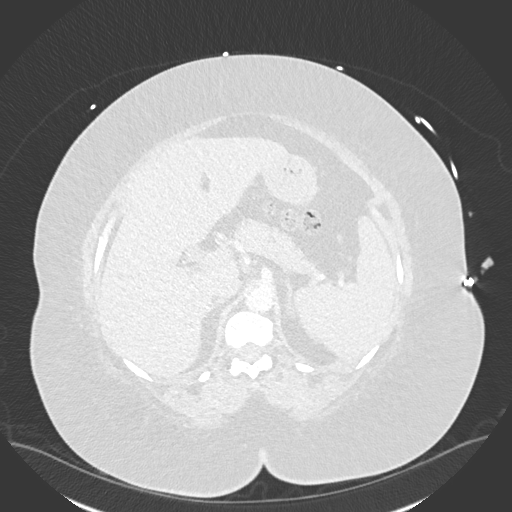
[im 35/350  mediastinal]
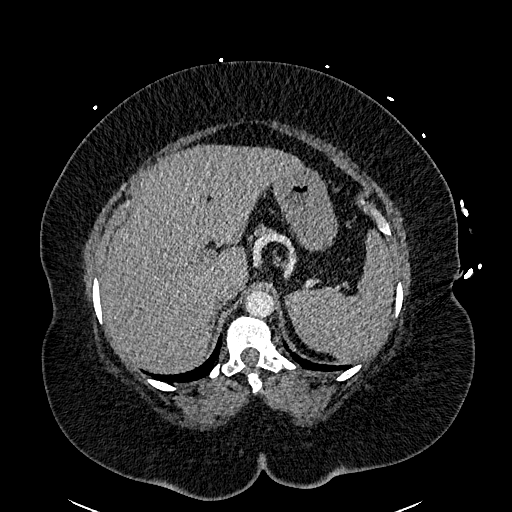
[im 53/350  lung]
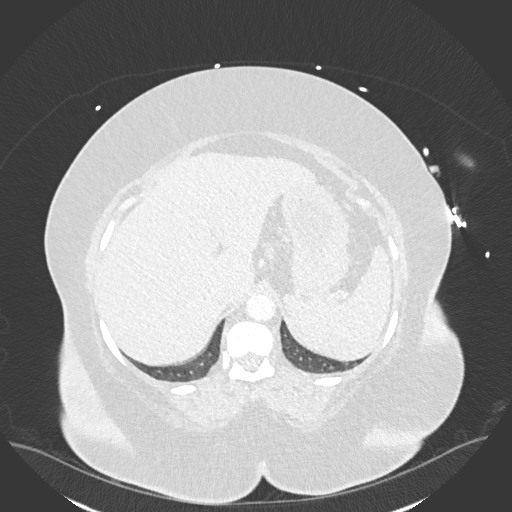
[im 70/350  mediastinal]
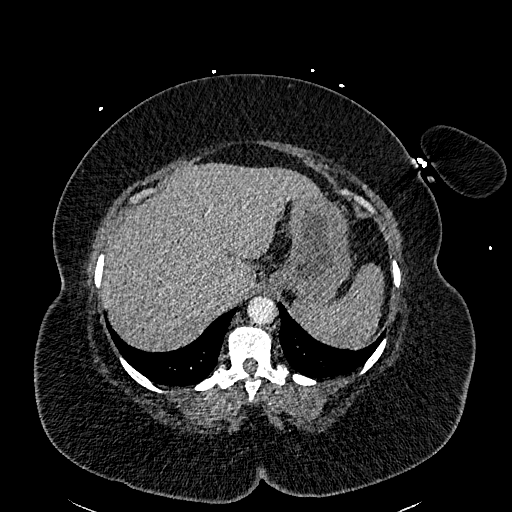
[im 105/350  lung]
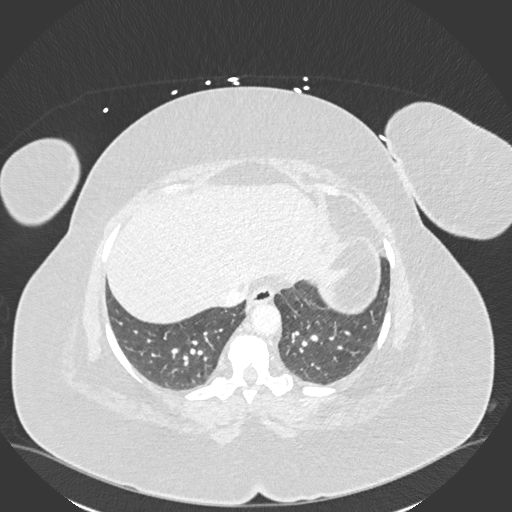
[im 123/350  mediastinal]
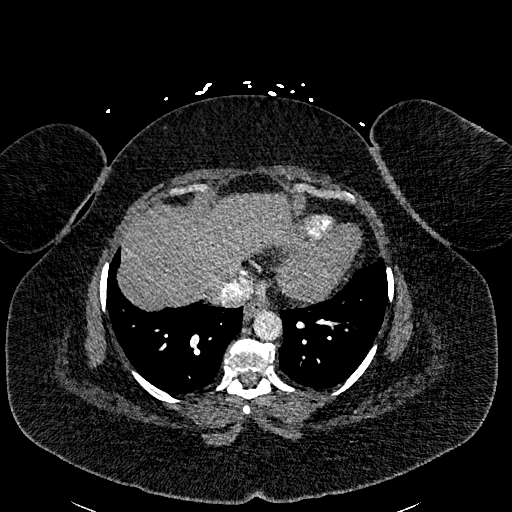
[im 140/350  lung]
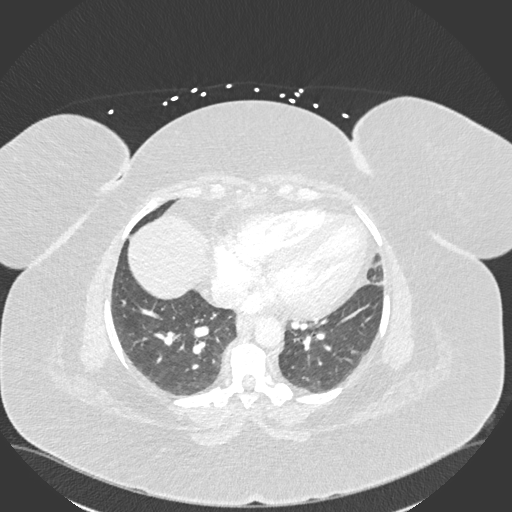
[im 158/350  mediastinal]
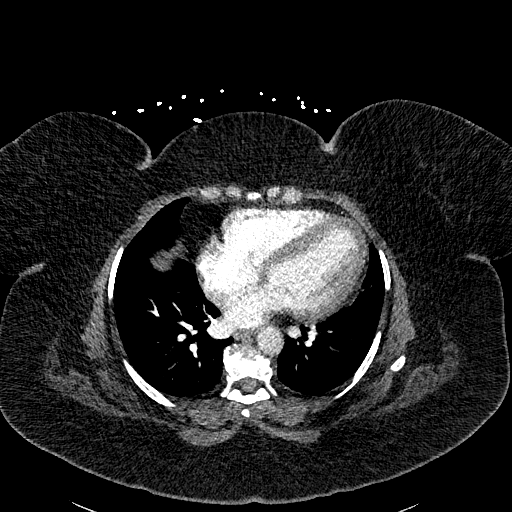
[im 175/350  lung]
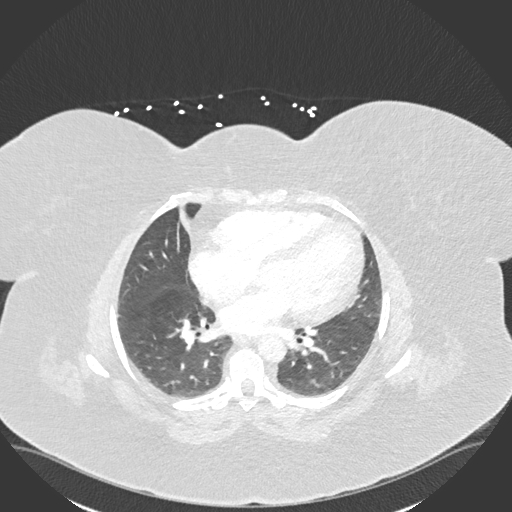
[im 192/350  mediastinal]
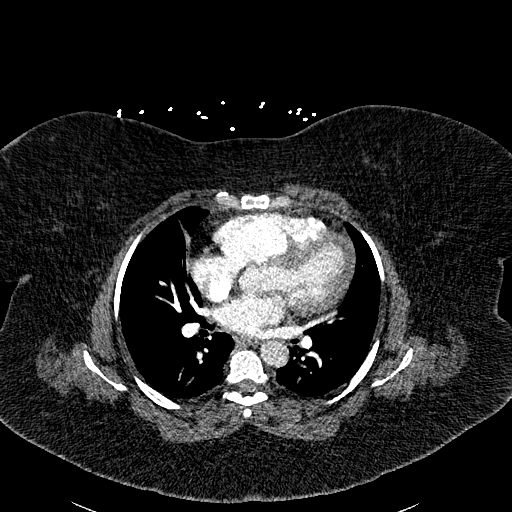
[im 210/350  lung]
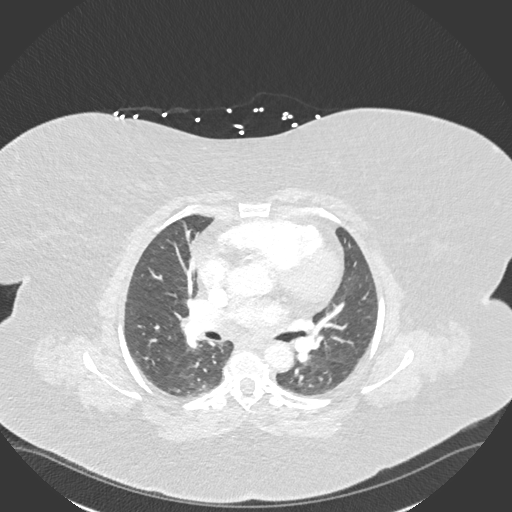
[im 227/350  mediastinal]
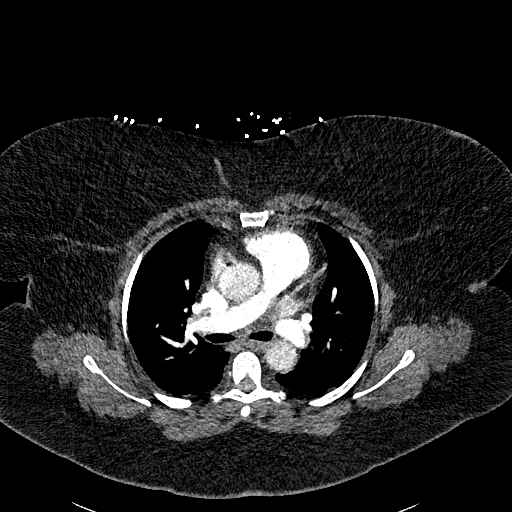
[im 245/350  lung]
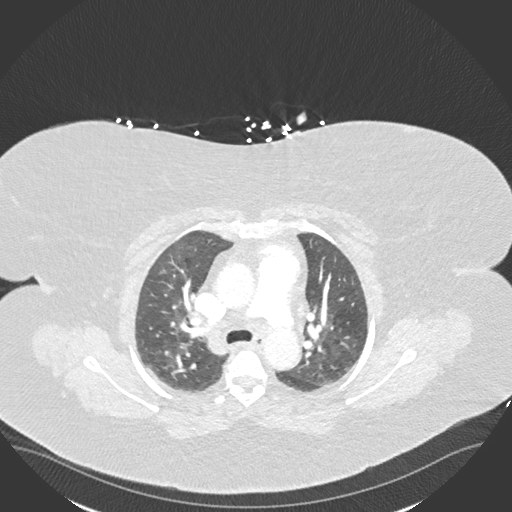
[im 280/350  mediastinal]
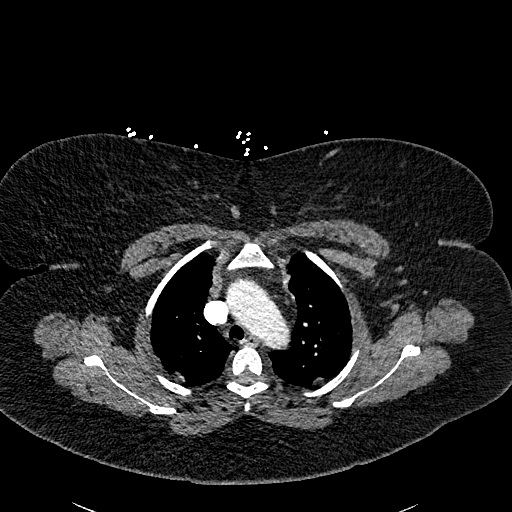
[im 297/350  lung]
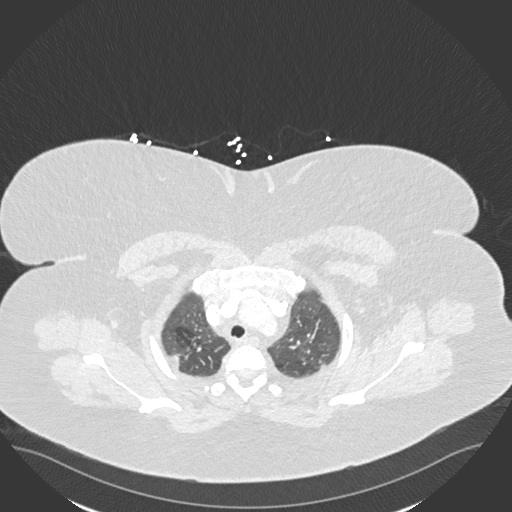
[im 315/350  mediastinal]
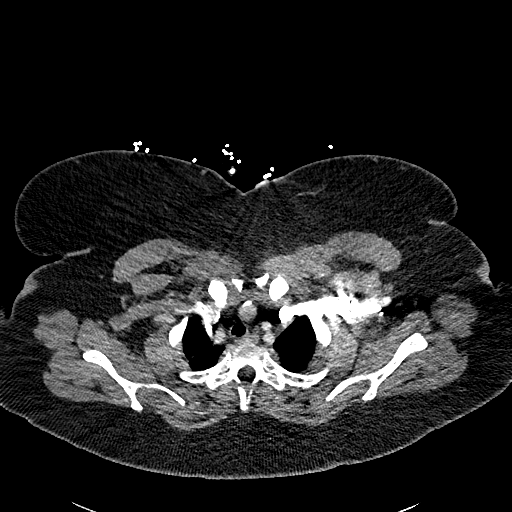
[im 332/350  lung]
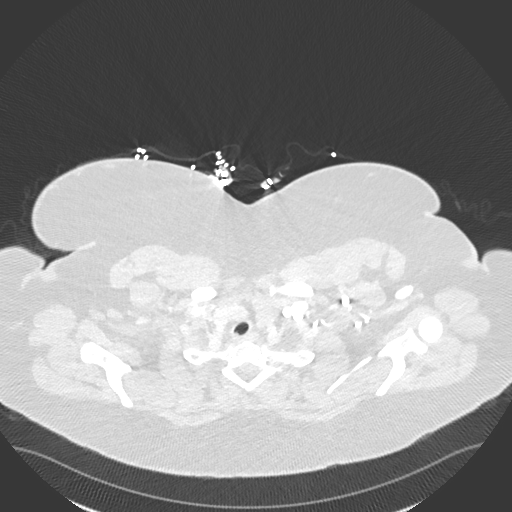

[Series 8: pe 2mm cor · coronal · 0.47mm/px · 1 of 151 slices shown]
[im 76/151  mediastinal]
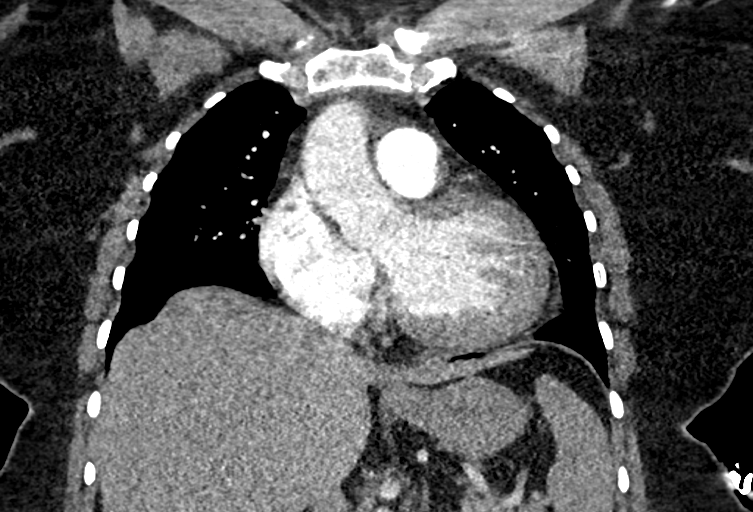

[18 of 36 positions shown; findings below may reference images not displayed]

RADIATION DOSE REDUCTION: This exam was performed according to the
departmental dose-optimization program which includes automated
exposure control, adjustment of the mA and/or kV according to
patient size and/or use of iterative reconstruction technique.

CONTRAST:  75mL OMNIPAQUE IOHEXOL 350 MG/ML SOLN
FINDINGS: Cardiovascular: There are no filling defects within the pulmonary
arteries to suggest pulmonary embolus. Exam not tailored for aortic
assessment, allowing for this there is no aortic dissection or acute
aortic findings. No aortic aneurysm. Heart size upper normal. No
pericardial effusion.

Mediastinum/Nodes: No mediastinal or hilar adenopathy no esophageal
wall thickening. No enlarged axillary nodes. No visualized thyroid
nodule.

Lungs/Pleura: Heterogeneous pulmonary parenchyma. Linear atelectasis
in the paramediastinal right middle lobe. Minimal linear atelectasis
at the right lung apex. No pneumonia or confluent airspace disease.
There is central bronchial thickening. No pleural effusion. Tiny 2
mm perifissural right upper lobe nodule series 6, image 40. No
pulmonary mass.

Upper Abdomen: Suspected hepatic steatosis and hepatomegaly.
Possible gallstones. No acute upper abdominal findings.

Musculoskeletal: There are no acute or suspicious osseous
abnormalities. No chest wall soft tissue abnormalities.

Review of the MIP images confirms the above findings.
IMPRESSION: 1. No pulmonary embolus.
2. Heterogeneous pulmonary parenchyma with central bronchial
thickening, can be seen with small airways disease.
3. Suspected hepatic steatosis and hepatomegaly in the upper
abdomen. Possible gallstones.

## 2023-10-22 IMAGING — DX DG CHEST 1V PORT
1 series · 1 of 1 positions shown · non-contrast
Comparison: None.

CLINICAL DATA: Chest pain

EXAM:
PORTABLE CHEST 1 VIEW

[chest ap]
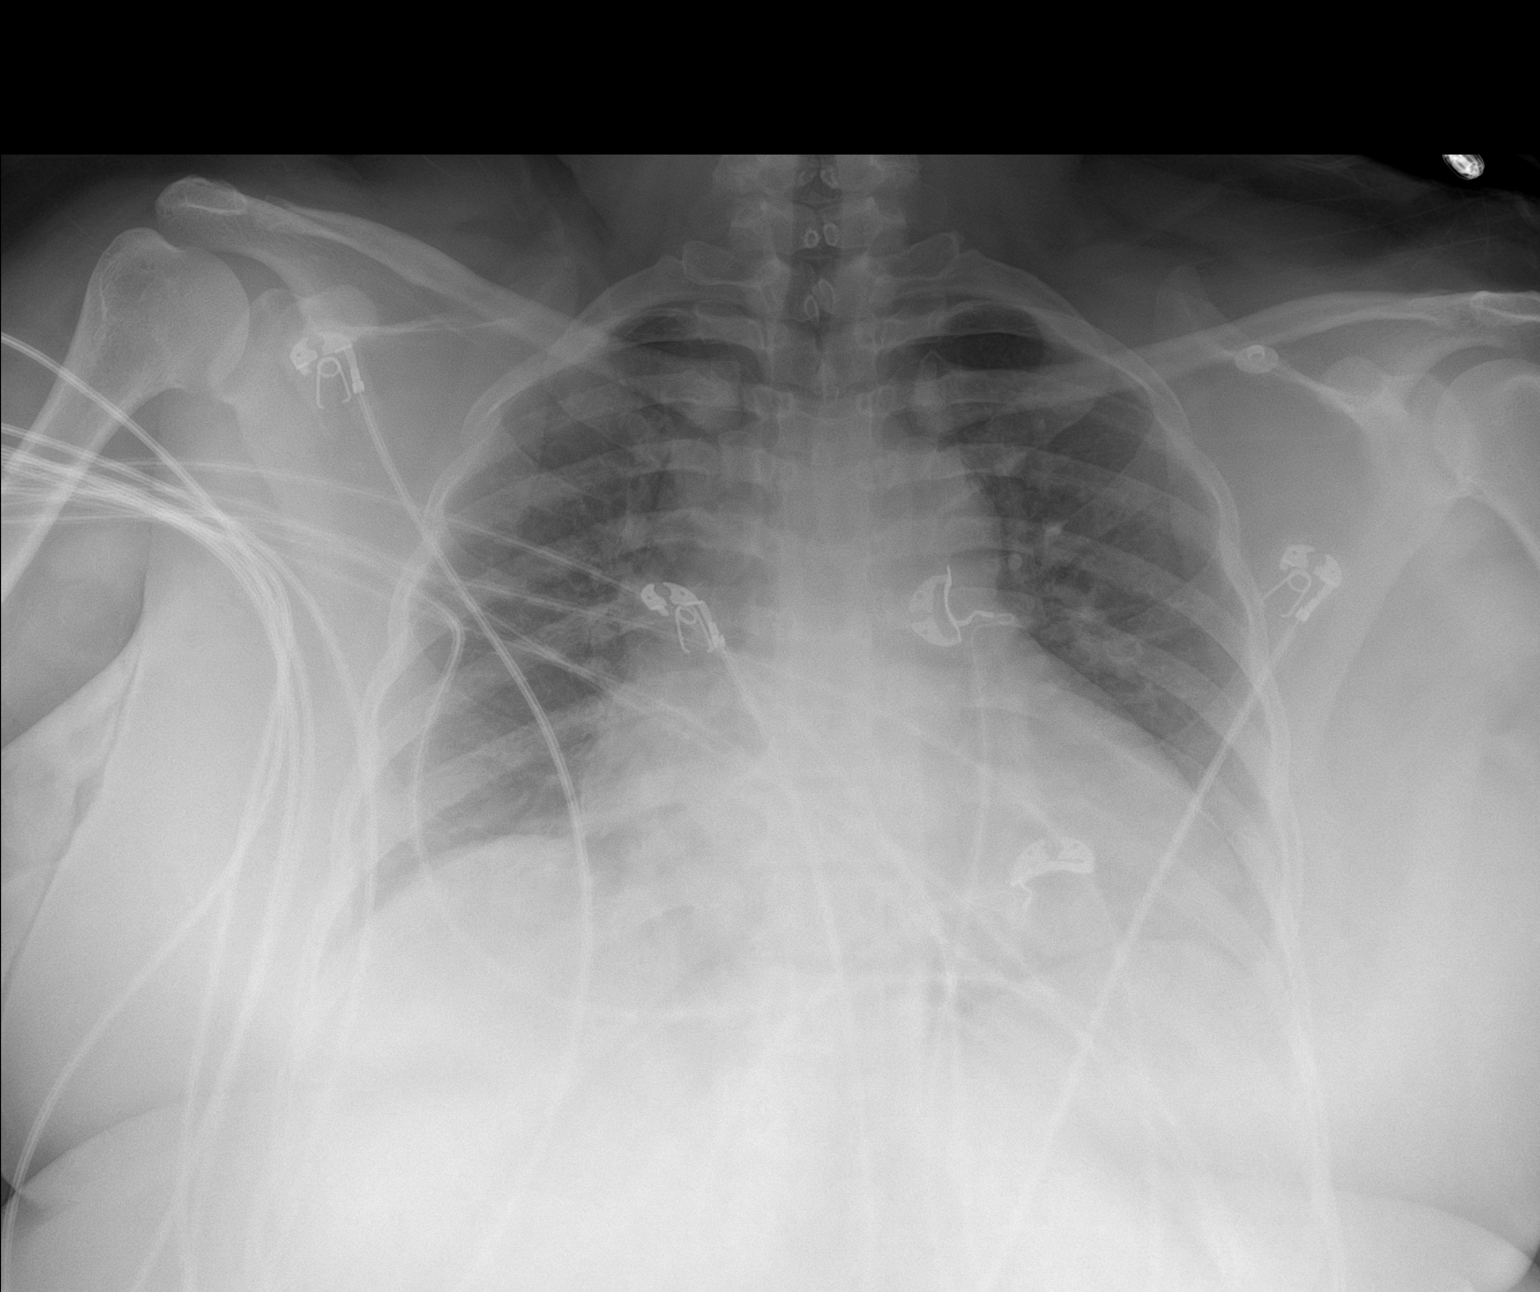

[1 of 1 positions shown; findings below may reference images not displayed]

FINDINGS: Mild bilateral interstitial thickening. No focal consolidation. No
pleural effusion or pneumothorax. Stable cardiomegaly.

No acute osseous abnormality.
IMPRESSION: 1. Cardiomegaly with mild pulmonary vascular congestion.

## 2023-10-25 ENCOUNTER — Telehealth: Payer: Self-pay | Admitting: Nurse Practitioner

## 2023-10-25 ENCOUNTER — Telehealth: Payer: Self-pay

## 2023-10-25 ENCOUNTER — Other Ambulatory Visit (HOSPITAL_COMMUNITY): Payer: Self-pay

## 2023-10-25 NOTE — Telephone Encounter (Signed)
 I have contacted [facility] and completed medication refill of all meds except Lantus  (too soon). Medications will be mailed to patient home.  Perla Bradford Bronsen Serano RN BSN PCCN  [facility] [phone]-cell [phone]-office

## 2023-10-25 NOTE — Telephone Encounter (Unsigned)
 Copied from CRM [id number]. Topic: Clinical - Prescription Issue >> Oct 25, 2023 11:30 AM Elle L wrote: Reason for CRM: Tameka, the patient's case manager, was calling for a prescription refill for the patient but did not have the name and milligram of the medication at this time. She will reach out to the pharmacy.

## 2023-11-17 ENCOUNTER — Other Ambulatory Visit (HOSPITAL_COMMUNITY): Payer: Self-pay

## 2023-11-17 ENCOUNTER — Other Ambulatory Visit: Payer: Self-pay | Admitting: Nurse Practitioner

## 2023-11-17 DIAGNOSIS — E1165 Type 2 diabetes mellitus with hyperglycemia: Secondary | ICD-10-CM

## 2023-11-17 MED ORDER — TRULICITY 1.5 MG/0.5ML ~~LOC~~ SOAJ
1.5000 mg | SUBCUTANEOUS | 2 refills | Status: DC
  Filled 2023-11-17: qty 2, 28d supply, fill #0
  Filled 2023-12-24: qty 2, 28d supply, fill #1

## 2023-11-18 ENCOUNTER — Other Ambulatory Visit (HOSPITAL_COMMUNITY): Payer: Self-pay

## 2023-12-15 ENCOUNTER — Ambulatory Visit: Admitting: Internal Medicine

## 2023-12-15 ENCOUNTER — Encounter: Payer: Self-pay | Admitting: Internal Medicine

## 2023-12-15 NOTE — Progress Notes (Deleted)
 Name: Anna Stephens  MRN/ DOB: [id number], 1965/12/31   Age/ Sex: 58 y.o., female    PCP: [name], NP   Reason for Endocrinology Evaluation: Type 2 Diabetes Mellitus     Date of Initial Endocrinology Visit: 12/15/2023     PATIENT IDENTIFIER: Anna Stephens is a 58 y.o. female with a past medical history of HTN, DM. The patient presented for initial endocrinology clinic visit on 12/15/2023 for consultative assistance with her diabetes management.    HPI: Anna Stephens was    Diagnosed with DM yrs ago Prior Medications tried/Intolerance: *** Currently checking blood sugars *** x / day,  before breakfast and ***.  Hypoglycemia episodes : ***               Symptoms: ***                 Frequency: ***/  Hemoglobin A1c has ranged from 9.7% in 2025, peaking at >15.05 in 2023.  In terms of diet, the patient ***   HOME DIABETES REGIMEN: Metformin  500 mg XR 2 tabs daily Trulicity  1.5 mg weekly Lantus  16 units daily  Statin: No ACE-I/ARB: Yes Prior Diabetic Education: {Yes/No:11203}   METER DOWNLOAD SUMMARY: Date range evaluated: *** Fingerstick Blood Glucose Tests = *** Average Number Tests/Day = *** Overall Mean FS Glucose = *** Standard Deviation = ***  BG Ranges: Low = *** High = ***   Hypoglycemic Events/30 Days: BG < 50 = *** Episodes of symptomatic severe hypoglycemia = ***   DIABETIC COMPLICATIONS: Microvascular complications:  *** Denies: CKD Last eye exam: Completed   Macrovascular complications:   Denies: CAD, PVD, CVA   PAST HISTORY: Past Medical History:  Past Medical History:  Diagnosis Date   Diabetes mellitus without complication (HCC)    Hypertension    Past Surgical History: No past surgical history on file.  Social History:  reports that she has never smoked. She has never used smokeless tobacco. She reports that she does not currently use drugs. No history on file for alcohol use. Family History:  Family History  Problem  Relation Age of Onset   Diabetes Father      HOME MEDICATIONS: Allergies as of 12/15/2023   No Known Allergies      Medication List        Accurate as of December 15, 2023  7:06 AM. If you have any questions, ask your nurse or doctor.          Accu-Chek Guide Test test strip Generic drug: glucose blood Use to check blood sugar twice daily.   Accu-Chek Guide w/Device Kit Use to check blood sugar twice daily.   Accu-Chek Softclix Lancets lancets Use as instructed   Accu-Chek Softclix Lancets lancets Use to check blood sugar twice daily.   amLODipine  5 MG tablet Commonly known as: NORVASC  Take 1 tablet (5 mg total) by mouth daily.   Lantus  SoloStar 100 UNIT/ML Solostar Pen Generic drug: insulin  glargine Inject 16 Units into the skin daily.   losartan -hydrochlorothiazide  100-25 MG tablet Commonly known as: Hyzaar  Take 1 tablet by mouth daily.   metFORMIN  500 MG 24 hr tablet Commonly known as: GLUCOPHAGE -XR Take 1 tablet (500 mg total) by mouth 2 (two) times daily with a meal.   ondansetron  4 MG disintegrating tablet Commonly known as: ZOFRAN -ODT Take 1 tablet (4 mg total) by mouth every 8 (eight) hours as needed for nausea or vomiting.   OneTouch Delica Plus Lancing Misc Use to check blood sugar  twice daily. May substitute to any manufacturer covered by patient's insurance.   Trulicity  1.5 MG/0.5ML Soaj Generic drug: Dulaglutide  Inject 1.5 mg into the skin once a week.   Unifine Pentips 32G X 4 MM Misc Generic drug: Insulin  Pen Needle Use to inject insulin  daily         ALLERGIES: No Known Allergies   REVIEW OF SYSTEMS: A comprehensive ROS was conducted with the patient and is negative except as per HPI    OBJECTIVE:   VITAL SIGNS: There were no vitals taken for this visit.   PHYSICAL EXAM:  General: Pt appears well and is in NAD  Neck: General: Supple without adenopathy or carotid bruits. Thyroid: Thyroid size normal.  No goiter or nodules  appreciated.   Lungs: Clear with good BS bilat   Heart: RRR   Abdomen:  soft, nontender  Extremities:  Lower extremities - No pretibial edema.   Neuro: MS is good with appropriate affect, pt is alert and Ox3    DM foot exam:    DATA REVIEWED:  Lab Results  Component Value Date   HGBA1C 9.7 (A) 10/04/2023   HGBA1C 10.4 (A) 07/05/2023   HGBA1C 14.5 (A) 04/01/2023    Latest Reference Range & Units 07/05/23 11:16  Sodium 134 - 144 mmol/L 139  Potassium 3.5 - 5.2 mmol/L 4.2  Chloride 96 - 106 mmol/L 104  CO2 20 - 29 mmol/L 24  Glucose 70 - 99 mg/dL 690 (H)  BUN 6 - 24 mg/dL 11  Creatinine 9.42 - 8.99 mg/dL 9.42  Calcium 8.7 - 89.7 mg/dL 8.5 (L)  BUN/Creatinine Ratio 9 - 23  19  eGFR >59 mL/min/1.73 106  Alkaline Phosphatase 44 - 121 IU/L 72  Albumin 3.8 - 4.9 g/dL 3.7 (L)  AST 0 - 40 IU/L 10  ALT 0 - 32 IU/L 19  Total Protein 6.0 - 8.5 g/dL 6.8  Total Bilirubin 0.0 - 1.2 mg/dL 0.3    Latest Reference Range & Units 10/04/23 11:53  Microalbumin, Urine Not Estab. ug/mL 10.3  MICROALB/CREAT RATIO 0 - 29 mg/g creat 9  Creatinine, Urine Not Estab. mg/dL 882.2      ASSESSMENT / PLAN / RECOMMENDATIONS:   1) Type 2 Diabetes Mellitus, ***controlled, With*** complications - Most recent A1c of *** %. Goal A1c < 7.0 %.    Plan: GENERAL: ***  MEDICATIONS: ***  EDUCATION / INSTRUCTIONS: BG monitoring instructions: Patient is instructed to check her blood sugars *** times a day, ***. Call [facility] if: BG persistently < 70  I reviewed the Rule of 15 for the treatment of hypoglycemia in detail with the patient. Literature supplied.   2) Diabetic complications:  Eye: Does not have known diabetic retinopathy.  Neuro/ Feet: Does *** have known diabetic peripheral neuropathy. Renal: Patient does not have known baseline CKD. She is  on an ACEI/ARB at present.      Signed electronically by: Stefano Redgie Butts, MD  [facility]  [facility] [address] [place] [place], [place] [address] Phone: [phone] FAX: [phone]   CC: [name], NP [address] [place] [address] Phone: [phone]  Fax: [phone]    Return to Endocrinology clinic as below: Future Appointments  Date Time Provider Department Center  12/15/2023 10:10 AM Charlton Boule, Donell Redgie, MD [facility] None  01/03/2024 10:20 AM [name], NP [facility] None

## 2023-12-24 ENCOUNTER — Other Ambulatory Visit (HOSPITAL_COMMUNITY): Payer: Self-pay

## 2023-12-25 NOTE — Congregational Nurse Program (Signed)
  Dept: [phone]   Congregational Nurse Program Note  Date of Encounter: 12/25/2023  Past Medical History: Past Medical History:  Diagnosis Date   Diabetes mellitus without complication (HCC)    Hypertension     Encounter Details:  Community Questionnaire - 12/24/23 1500       Questionnaire   Ask client: Do you give verbal consent for me to treat you today? Yes    Student Assistance N/A    Location Patient Served  [facility]    Encounter Setting Home    Population Status Migrant/Refugee    Insurance Medicaid    Insurance/Financial Assistance Referral N/A    Medication Have Medication Insecurities;Provided Medication Assistance;Patient Medications Reviewed    Medical Provider Yes    Screening Referrals Made N/A    Medical Referrals Made N/A    Medical Appointment Completed [facility]    CNP Interventions Counsel;Advocate/Support;Educate;Navigate Healthcare System;Case Management    Screenings CN Performed N/A    ED Visit Averted Yes    Life-Saving Intervention Made N/A         Friday July 11th 3pm  Home visit done, patient  is at work but daughter and son are at home. Son was able to show me her medications for review. She has run out of Trulicity  and Lantus . Son reports that her sugar at home today was 240s.  I have contacted [facility] and completed medication refill of all meds. Medications will be delivered to patient home. I have called patient and left a message to expect medication delivery on Monday or Tuesday.    [name] Raelynn Corron RN BSN PCCN  [facility] [phone]-cell [phone]-office

## 2023-12-27 ENCOUNTER — Other Ambulatory Visit: Payer: Self-pay

## 2023-12-29 LAB — POCT GLYCOSYLATED HEMOGLOBIN (HGB A1C): Hemoglobin-A1c: 212

## 2023-12-29 NOTE — Congregational Nurse Program (Signed)
  Dept: [phone]   Congregational Nurse Program Note  Date of Encounter: 12/29/2023  Past Medical History: Past Medical History:  Diagnosis Date   Diabetes mellitus without complication (HCC)    Hypertension     Encounter Details:  Community Questionnaire - 12/29/23 1553       Questionnaire   Ask client: Do you give verbal consent for me to treat you today? Yes    Student Assistance N/A    Location Patient Served  [facility]    Encounter Setting Home    Population Status Migrant/Refugee    Insurance Medicaid    Insurance/Financial Assistance Referral N/A    Medication Have Medication Insecurities;Provided Medication Assistance;Patient Medications Reviewed    Medical Provider Yes    Screening Referrals Made N/A    Medical Referrals Made N/A    Medical Appointment Completed [facility]    CNP Interventions Counsel;Advocate/Support;Educate;Navigate Healthcare System;Case Management    Screenings CN Performed Blood Glucose    ED Visit Averted Yes    Life-Saving Intervention Made N/A         Home visit done. Patient is at home with her daughter. Reviewed medications that were delivered yesterday.She has not started using the medication. I asked patient to demonstrate how she uses her injectables. She dialed 14 units of Lantus  and I corrected the dose to 16 units as ordered. It seams like patient has been using 14 units instead of 16 units.Informed patient to be administering 16 units going forth. Regarding Trulicity , she was able to tell me that she injects one a week. She injected the dose correctly in my presence. For PO medications, She was able to verbalize correctly how to take the pills. I will continue to assist as needed.  [name] Alazne Quant RN BSN PCCN  [facility] [phone]-cell [phone]-office

## 2024-01-03 ENCOUNTER — Ambulatory Visit (INDEPENDENT_AMBULATORY_CARE_PROVIDER_SITE_OTHER): Payer: Self-pay | Admitting: Nurse Practitioner

## 2024-01-03 ENCOUNTER — Telehealth: Payer: Self-pay

## 2024-01-03 ENCOUNTER — Encounter: Payer: Self-pay | Admitting: Nurse Practitioner

## 2024-01-03 VITALS — BP 132/88 | HR 86 | Temp 98.4°F | Wt 237.0 lb

## 2024-01-03 DIAGNOSIS — E1165 Type 2 diabetes mellitus with hyperglycemia: Secondary | ICD-10-CM | POA: Diagnosis not present

## 2024-01-03 DIAGNOSIS — Z794 Long term (current) use of insulin: Secondary | ICD-10-CM

## 2024-01-03 DIAGNOSIS — E119 Type 2 diabetes mellitus without complications: Secondary | ICD-10-CM

## 2024-01-03 LAB — POCT GLYCOSYLATED HEMOGLOBIN (HGB A1C): Hemoglobin A1C: 7.3 % — AB (ref 4.0–5.6)

## 2024-01-03 MED ORDER — TRULICITY 1.5 MG/0.5ML ~~LOC~~ SOAJ: 1.5000 mg | SUBCUTANEOUS | 2 refills | Status: DC

## 2024-01-03 MED ORDER — ACCU-CHEK SOFTCLIX LANCETS MISC: 3 refills | Status: DC

## 2024-01-03 MED ORDER — LANTUS SOLOSTAR 100 UNIT/ML ~~LOC~~ SOPN: 16.0000 [IU] | PEN_INJECTOR | Freq: Every day | SUBCUTANEOUS | 99 refills | Status: DC

## 2024-01-03 NOTE — Telephone Encounter (Signed)
 Patient called with appointment reminder. Transportation assistance will be provided. Reminded patient to carry all her meds to the appointment for review.  [name] Magenta Schmiesing RN BSN PCCN  [facility] [phone]-cell [phone]-office

## 2024-01-03 NOTE — Progress Notes (Signed)
 Subjective   Patient ID: Anna Stephens, female    DOB: 1966-04-15, 58 y.o.   MRN: [id number]  Chief Complaint  Patient presents with   Medical Management of Chronic Issues    Referring provider: [name], NP  [name] is a 58 y.o. female with Past Medical History: No date: Diabetes mellitus without complication (HCC) No date: Hypertension   HPI  Patient presents today for follow-up visit for diabetes, hypertension, hyperlipidemia.  Patient's A1c in office today is 7.3.  She does have an interpreter in the room with her.  Patient states that she is compliant with her medications. Pharmacy is following for medication management. Will place a referral to endocrinology. Denies f/c/s, n/v/d, hemoptysis, PND, leg swelling Denies chest pain or edema.    No Known Allergies  Immunization History  Administered Date(s) Administered   PNEUMOCOCCAL CONJUGATE-20 02/01/2022    Tobacco History: Social History   Tobacco Use  Smoking Status Never  Smokeless Tobacco Never   Counseling given: Not Answered   Outpatient Encounter Medications as of 01/03/2024  Medication Sig   Accu-Chek Softclix Lancets lancets Use as instructed   amLODipine  (NORVASC ) 5 MG tablet Take 1 tablet (5 mg total) by mouth daily.   Blood Glucose Monitoring Suppl (ACCU-CHEK GUIDE) w/Device KIT Use to check blood sugar twice daily.   Glucose Blood (BLOOD GLUCOSE TEST STRIPS) STRP Use to check blood sugar twice daily.   Insulin  Pen Needle (PEN NEEDLES) 32G X 4 MM MISC Use to inject insulin  daily   Lancet Device MISC Use to check blood sugar twice daily. May substitute to any manufacturer covered by patient's insurance.   losartan -hydrochlorothiazide  (HYZAAR ) 100-25 MG tablet Take 1 tablet by mouth daily.   metFORMIN  (GLUCOPHAGE -XR) 500 MG 24 hr tablet Take 1 tablet (500 mg total) by mouth 2 (two) times daily with a meal.   ondansetron  (ZOFRAN -ODT) 4 MG disintegrating tablet Take 1 tablet (4 mg total)  by mouth every 8 (eight) hours as needed for nausea or vomiting.   [DISCONTINUED] Accu-Chek Softclix Lancets lancets Use to check blood sugar twice daily.   [DISCONTINUED] Dulaglutide  (TRULICITY ) 1.5 MG/0.5ML SOAJ Inject 1.5 mg into the skin once a week.   [DISCONTINUED] insulin  glargine (LANTUS  SOLOSTAR) 100 UNIT/ML Solostar Pen Inject 16 Units into the skin daily.   Accu-Chek Softclix Lancets lancets Use to check blood sugar twice daily.   Dulaglutide  (TRULICITY ) 1.5 MG/0.5ML SOAJ Inject 1.5 mg into the skin once a week.   insulin  glargine (LANTUS  SOLOSTAR) 100 UNIT/ML Solostar Pen Inject 16 Units into the skin daily.   No facility-administered encounter medications on file as of 01/03/2024.    Review of Systems  Review of Systems  Constitutional: Negative.   HENT: Negative.    Cardiovascular: Negative.   Gastrointestinal: Negative.   Allergic/Immunologic: Negative.   Neurological: Negative.   Psychiatric/Behavioral: Negative.       Objective:   BP 132/88   Pulse 86   Temp 98.4 F (36.9 C) (Oral)   Wt 237 lb (107.5 kg)   SpO2 97%   BMI 45.52 kg/m   Wt Readings from Last 5 Encounters:  01/03/24 237 lb (107.5 kg)  10/04/23 242 lb 3.2 oz (109.9 kg)  07/05/23 244 lb 9.6 oz (110.9 kg)  04/01/23 225 lb (102.1 kg)  04/02/22 232 lb (105.2 kg)     Physical Exam Vitals and nursing note reviewed.  Constitutional:      General: She is not in acute distress.    Appearance:  She is well-developed.  Cardiovascular:     Rate and Rhythm: Normal rate and regular rhythm.  Pulmonary:     Effort: Pulmonary effort is normal.     Breath sounds: Normal breath sounds.  Neurological:     Mental Status: She is alert and oriented to person, place, and time.       Assessment & Plan:   Type 2 diabetes mellitus without complication, with long-term current use of insulin  (HCC) -     POCT glycosylated hemoglobin (Hb A1C) -     CBC -     Comprehensive metabolic panel with GFR -      AMB Referral [facility] Care Management  Type 2 diabetes mellitus with hyperglycemia, unspecified whether long term insulin  use (HCC) -     Trulicity ; Inject 1.5 mg into the skin once a week.  Dispense: 2 mL; Refill: 2  Other orders -     Accu-Chek Softclix Lancets; Use to check blood sugar twice daily.  Dispense: 100 each; Refill: 3 -     Lantus  SoloStar; Inject 16 Units into the skin daily.  Dispense: 15 mL; Refill: PRN     Return in about 3 months (around 04/04/2024).   Bascom GORMAN Borer, NP 01/03/2024

## 2024-01-04 LAB — COMPREHENSIVE METABOLIC PANEL WITH GFR
ALT: 19 IU/L (ref 0–32)
AST: 13 IU/L (ref 0–40)
Albumin: 3.8 g/dL (ref 3.8–4.9)
Alkaline Phosphatase: 62 IU/L (ref 44–121)
BUN/Creatinine Ratio: 14 (ref 9–23)
BUN: 10 mg/dL (ref 6–24)
Bilirubin Total: 0.4 mg/dL (ref 0.0–1.2)
CO2: 23 mmol/L (ref 20–29)
Calcium: 8.9 mg/dL (ref 8.7–10.2)
Chloride: 103 mmol/L (ref 96–106)
Creatinine, Ser: 0.71 mg/dL (ref 0.57–1.00)
Globulin, Total: 3.2 g/dL (ref 1.5–4.5)
Glucose: 256 mg/dL — ABNORMAL HIGH (ref 70–99)
Potassium: 3.9 mmol/L (ref 3.5–5.2)
Sodium: 140 mmol/L (ref 134–144)
Total Protein: 7 g/dL (ref 6.0–8.5)
eGFR: 99 mL/min/1.73 (ref >59)

## 2024-01-04 LAB — CBC
Hematocrit: 40.8 % (ref 34.0–46.6)
Hemoglobin: 13.2 g/dL (ref 11.1–15.9)
MCH: 30.6 pg (ref 26.6–33.0)
MCHC: 32.4 g/dL (ref 31.5–35.7)
MCV: 94 fL (ref 79–97)
Platelets: 210 x10E3/uL (ref 150–450)
RBC: 4.32 x10E6/uL (ref 3.77–5.28)
RDW: 13 % (ref 11.7–15.4)
WBC: 5.3 x10E3/uL (ref 3.4–10.8)

## 2024-01-05 ENCOUNTER — Ambulatory Visit: Payer: Self-pay | Admitting: Nurse Practitioner

## 2024-01-19 ENCOUNTER — Encounter

## 2024-01-31 ENCOUNTER — Telehealth: Payer: Self-pay | Admitting: *Deleted

## 2024-01-31 NOTE — Progress Notes (Unsigned)
 Care Guide Pharmacy Note  01/31/2024 Name: Anna Stephens MRN: [id number] DOB: 1966-01-04  Referred By: [name], NP Reason for referral: Complex Care Management (Initial outreach to schedule referral with PharmD [facility])   Anna Stephens is a 58 y.o. year old female who is a primary care patient of [name], NP.  Anna Stephens was referred to the pharmacist for assistance related to: DMII  An unsuccessful telephone outreach was attempted today to contact the patient who was referred to the pharmacy team for assistance with medication management. Additional attempts will be made to contact the patient.  Anna Stephens  [facility]  [facility], [facility] Guide  Direct Dial: [phone]  Fax [phone]

## 2024-02-01 NOTE — Progress Notes (Addendum)
 Care Guide Pharmacy Note  02/01/2024 Name: Anna Stephens MRN: [id number] DOB: 28-Feb-1966  Referred By: [name], NP Reason for referral: Complex Care Management (Initial outreach to schedule referral with PharmD [facility])   Anna Stephens is a 58 y.o. year old female who is a primary care patient of [name], NP.  Anna Stephens was referred to the pharmacist for assistance related to: DMII Used [employer] [phone] named Levorn   Successful contact was made with the patient to discuss pharmacy services including being ready for the pharmacist to call at least 5 minutes before the scheduled appointment time and to have medication bottles and any blood pressure readings ready for review. The patient agreed to meet with the pharmacist via Face to Face  on (date/time). 03/21/24 at 130 PM  Anna Stephens  [facility], [facility] Guide  Direct Dial: [phone]  Fax [phone]

## 2024-02-04 NOTE — Congregational Nurse Program (Signed)
  Dept: [phone]   Congregational Nurse Program Note  Date of Encounter: 02/04/2024  Past Medical History: Past Medical History:  Diagnosis Date   Diabetes mellitus without complication [facility])    Hypertension     Encounter Details:  Community Questionnaire - 02/04/24 0910       Questionnaire   Ask client: Do you give verbal consent for me to treat you today? Yes    Student Assistance N/A    Location Patient Served  [facility]    Encounter Setting Phone/Text/Email    Population Status Migrant/Refugee    Insurance Medicaid    Insurance/Financial Assistance Referral N/A    Medication Have Medication Insecurities;Provided Medication Assistance;Patient Medications Reviewed    Medical Provider Yes    Screening Referrals Made N/A    Medical Referrals Made N/A    Medical Appointment Completed [facility]    CNP Interventions Counsel;Advocate/Support;Educate;Navigate Healthcare System;Case Management    Screenings CN Performed N/A    ED Visit Averted Yes    Life-Saving Intervention Made N/A         I have contacted patient for follow up. She has run out of Trulicity . Patient advised to go to pharmacy for refill. No other concerns at this time.  [name] Adonica Fukushima RN BSN PCCN  [facility] [phone]-cell [phone]-office

## 2024-02-08 ENCOUNTER — Other Ambulatory Visit (HOSPITAL_COMMUNITY): Payer: Self-pay

## 2024-02-08 ENCOUNTER — Other Ambulatory Visit: Payer: Self-pay | Admitting: Nurse Practitioner

## 2024-02-08 DIAGNOSIS — E1165 Type 2 diabetes mellitus with hyperglycemia: Secondary | ICD-10-CM

## 2024-02-08 LAB — GLUCOSE, POCT (MANUAL RESULT ENTRY): POC Glucose: 250 mg/dL — AB (ref 70–99)

## 2024-02-08 NOTE — Congregational Nurse Program (Signed)
  Dept: [phone]   Congregational Nurse Program Note  Date of Encounter: 02/08/2024  Past Medical History: Past Medical History:  Diagnosis Date   Diabetes mellitus without complication (HCC)    Hypertension     Encounter Details:  Community Questionnaire - 02/08/24 1236       Questionnaire   Ask client: Do you give verbal consent for me to treat you today? Yes    Student Assistance N/A    Location Patient Served  [facility]    Encounter Setting Home    Population Status Migrant/Refugee    Insurance Medicaid    Insurance/Financial Assistance Referral N/A    Medication Have Medication Insecurities;Provided Medication Assistance;Patient Medications Reviewed    Medical Provider Yes    Screening Referrals Made N/A    Medical Referrals Made N/A    Medical Appointment Completed [facility]    CNP Interventions Counsel;Advocate/Support;Educate;Navigate Healthcare System;Case Management    Screenings CN Performed N/A    ED Visit Averted Yes    Life-Saving Intervention Made N/A        Home visit done.  Patient at home with her daughter. Medication inspected, patient has run out of Trulicity . Medication prescription was sent to [facility], patient has not pick up due to transportation issues. I have called [facility] for medication delivery at home. Education provided on Latus. I asked patient to dial the correct units that she injects every day. Patient dialed incorrectly several times. Patient has illiteracy and language barriers. I have shown patient how to dial 16 units on the Latus pen. Practiced severally until she was able to dial correctly. Blood sugar using her home machine today is 250. Education provided on compliance.  Anna Pepe Mineau RN BSN PCCN  [facility] [phone]-cell [phone]-office

## 2024-02-19 ENCOUNTER — Other Ambulatory Visit (HOSPITAL_COMMUNITY): Payer: Self-pay

## 2024-02-29 ENCOUNTER — Telehealth: Payer: Self-pay

## 2024-02-29 ENCOUNTER — Telehealth: Payer: Self-pay | Admitting: Nurse Practitioner

## 2024-02-29 NOTE — Telephone Encounter (Signed)
 Received call from nurse [name] Muhoro ph: [phone]. Pt needs to reschedule appt to morning of 03/21/2024 with pharmacist or any day in the morning.

## 2024-02-29 NOTE — Telephone Encounter (Signed)
 Patient works in the afternoons and requesting morning appointment. I have called Patient care center to reschedule.  Appointment will be rescheduled accordingly. I will inform patient once the pharmacy appointment is rescheduled.  [name] Attila Mccarthy RN BSN PCCN  [facility] [phone]-cell [phone]-office

## 2024-02-29 NOTE — Telephone Encounter (Signed)
 Copied from CRM [id number]. Topic: General - Other >> Feb 29, 2024 12:55 PM Zebedee SAUNDERS wrote: Reason for CRM: Received call from nurse Dorothy Muhoro ph: [phone]. Pt needs to reschedule appt to morning of 03/21/2024 with pharmacist or any day in the morning.

## 2024-03-02 NOTE — Telephone Encounter (Signed)
 PharmD appointment scheduled. Spoke with Congregational Nurse Anna Stephens.10/22 at 830 AM   Anna Stephens  [facility], [facility] Guide  Direct Dial: [phone]  Fax [phone]

## 2024-03-20 ENCOUNTER — Telehealth: Payer: Self-pay

## 2024-03-20 NOTE — Telephone Encounter (Signed)
 Patient called with appointment reminder. Requesting to bring all her medications to the appointment.  [name] Sanna Porcaro RN BSN PCCN  [facility] [phone]-cell [phone]-office

## 2024-03-21 ENCOUNTER — Ambulatory Visit (INDEPENDENT_AMBULATORY_CARE_PROVIDER_SITE_OTHER): Payer: Self-pay

## 2024-03-21 ENCOUNTER — Other Ambulatory Visit (HOSPITAL_COMMUNITY): Payer: Self-pay

## 2024-03-21 DIAGNOSIS — I10 Essential (primary) hypertension: Secondary | ICD-10-CM | POA: Diagnosis not present

## 2024-03-21 DIAGNOSIS — Z794 Long term (current) use of insulin: Secondary | ICD-10-CM

## 2024-03-21 DIAGNOSIS — E119 Type 2 diabetes mellitus without complications: Secondary | ICD-10-CM | POA: Diagnosis not present

## 2024-03-21 MED ORDER — TRULICITY 3 MG/0.5ML ~~LOC~~ SOAJ: 3.0000 mg | SUBCUTANEOUS | 5 refills | Status: DC

## 2024-03-21 MED ORDER — AMLODIPINE BESYLATE 5 MG PO TABS: 5.0000 mg | ORAL_TABLET | Freq: Every day | ORAL | 3 refills | Status: DC

## 2024-03-21 MED ORDER — LANTUS SOLOSTAR 100 UNIT/ML ~~LOC~~ SOPN: 16.0000 [IU] | PEN_INJECTOR | Freq: Every day | SUBCUTANEOUS | 3 refills | Status: DC

## 2024-03-21 MED ORDER — ATORVASTATIN CALCIUM 20 MG PO TABS: 20.0000 mg | ORAL_TABLET | Freq: Every day | ORAL | 3 refills | Status: DC

## 2024-03-21 MED ORDER — METFORMIN HCL ER 500 MG PO TB24: 500.0000 mg | ORAL_TABLET | Freq: Two times a day (BID) | ORAL | 3 refills | Status: DC

## 2024-03-21 MED ORDER — LOSARTAN POTASSIUM-HCTZ 100-25 MG PO TABS: 1.0000 | ORAL_TABLET | Freq: Every day | ORAL | 3 refills | Status: DC

## 2024-03-21 NOTE — Progress Notes (Signed)
 03/21/2024 Name: Anna Stephens MRN: [id number] DOB: 1966-04-22  Chief Complaint  Patient presents with   Diabetes    Anna Stephens is a 58 y.o. year old female who was referred for medication management by their primary care provider, [name], NP. They presented for a face to face visit today. She is accompanied by a [facility] interpreter, Mardeen.   They were referred to the pharmacist by their PCP for assistance in managing diabetes . PMH includes HTN, T2DM (hx of HHS in 2023), BMI > 30.   Subjective: Patient was last seen by PCP, Bascom Oley, NP, on 01/03/24. At last visit, BP was 132/88 mmHg, HR 86. A1C had improved from 9.7% to 7.3%.  Today, patient presents in  good spirits and presents without  any assistance. She did not bring her medications or glucometer with her. She reports she is out of most of her medications (took her last dose of Trulicity  this past Saturday). Per fill hx, BP medications and metformin  not filled since July. She reports that [name]) was previously helping her obtain medications, but she is also able to get to [facility] pharmacy easily to pick up her medications herself. She states that she did take a pill this morning, but she is not sure what it was for.  Care Team: Primary Care Provider: [name], NP ; Next Scheduled Visit: 04/05/24   Medication Access/Adherence  Current Pharmacy:  [id number] - [place] ([place]), [place] - [address] [place] ([place]) [place] [address] Phone: [phone] Fax: [phone]   Patient reports affordability concerns with their medications: No  - confirms that $24 for 3 mo supply of medications (Medicaid copays) is affordable for her  Patient reports access/transportation concerns to their pharmacy: Yes  - but she is able to get to [facility] easily to pick up medications. Was previously using [facility] pharmacy with help of Dorothy, Pharmacist, hospital, but [name] reports patient does not consistently communicate when refills are needed so she would prefer us  to send her medications to [facility].  Patient reports adherence concerns with their medications:  Yes  - per fill hx and patient report   Diabetes:  Current medications: metformin  XR 500 mg BID (not filled since 12/27/23), Trulicity  1.5 mg weekly (Saturdays - took last dose in box a few days ago), Lantus  16 units daily (confirms taking daily)  Denies n/v, abdominal pain, constipation with Trulicity .  Current glucose readings: Accu Chek Guide Meter - checking once daily fasting - Unable to recall readings. She lists numbers like 21 and 32, but I do not think this is accurate.  Patient denies hypoglycemic s/sx including dizziness, shakiness, sweating. Patient reports hyperglycemic symptoms including polyuria, polydipsia, nocturia. Denies neuropathy blurred vision.  Current meal patterns: Reports she does not get very hungry, typically eats twice per day. - sometimes eats fufu. Reports that she eats vegetables including spinach, green cassova. Reports eating protein like ground meat, beef.  - Drinks: Mainly drinks water, occasionally has soda (once/month)  Current physical activity: Reports that she walks a lot  Current medication access support: Medicaid   Hypertension:  Current medications: amlodipine  5 mg daily (not filled since 12/27/23), losartan -hydrochlorothiazide  100-25 mg daily (not filled since 12/27/23), Lantus  16 units daily (not filled since 12/27/23) Medications previously tried:   Patient has an automated, upper arm home BP cuff - states she is not using  Patient denies hypotensive s/sx including dizziness, lightheadedness.  Patient denies hypertensive symptoms  including headache, chest pain, shortness of breath  Hyperlipidemia/ASCVD Risk Reduction  Current lipid lowering medications: none  Objective:  BP Readings from Last 3 Encounters:  03/21/24 (!)  160/94  01/03/24 132/88  10/04/23 (!) 156/104    Lab Results  Component Value Date   HGBA1C 7.3 (A) 01/03/2024   HGBA1C 212 12/29/2023   HGBA1C 9.7 (A) 10/04/2023       Latest Ref Rng & Units 01/03/2024   11:15 AM 07/05/2023   11:16 AM 04/01/2023   11:07 AM  BMP  Glucose 70 - 99 mg/dL 743  690  650   BUN 6 - 24 mg/dL 10  11  13    Creatinine 0.57 - 1.00 mg/dL 9.28  9.42  9.24   BUN/Creat Ratio 9 - 23 14  19  17    Sodium 134 - 144 mmol/L 140  139  136   Potassium 3.5 - 5.2 mmol/L 3.9  4.2  4.3   Chloride 96 - 106 mmol/L 103  104  97   CO2 20 - 29 mmol/L 23  24  26    Calcium 8.7 - 10.2 mg/dL 8.9  8.5  9.4     Lab Results  Component Value Date   CHOL 170 04/01/2023   HDL 50 04/01/2023   LDLCALC 97 04/01/2023   TRIG 131 04/01/2023   CHOLHDL 3.4 04/01/2023    Medications Reviewed Today     Reviewed by Brinda Lorain SQUIBB, RPH (Pharmacist) on 03/21/24 at 1642  Med List Status: <None>   Medication Order Taking? Sig Documenting Provider Last Dose Status Informant  Accu-Chek Softclix Lancets lancets [id number]  Use as instructed Rolinda Rogue, MD  Active   Accu-Chek Softclix Lancets lancets [id number]  Use to check blood sugar twice daily. [name], NP  Active   amLODipine  (NORVASC ) 5 MG tablet [id number]  Take 1 tablet (5 mg total) by mouth daily. For blood pressure.  Patient not taking: Reported on 03/21/2024   [name], NP  Active   atorvastatin (LIPITOR) 20 MG tablet [id number] Yes Take 1 tablet (20 mg total) by mouth daily. [name], NP  Active   Blood Glucose Monitoring Suppl (ACCU-CHEK GUIDE) w/Device KIT [id number]  Use to check blood sugar twice daily. [name], NP  Active     Discontinued 03/21/24 1122 (Dose change)   Dulaglutide  (TRULICITY ) 3 MG/0.5ML SOAJ [id number] Yes Inject 3 mg into the skin once a week. [name], NP  Active   Glucose Blood (BLOOD GLUCOSE TEST STRIPS) STRP [id number]  Use to check blood sugar twice daily. [name], NP  Active   insulin  glargine (LANTUS  SOLOSTAR) 100 UNIT/ML Solostar Pen [id number] Yes Inject 16 Units into the skin daily. May increase up to 20 units daily if instructed by your doctor. [name], NP  Active   Insulin  Pen Needle (PEN NEEDLES) 32G X 4 MM MISC [id number]  Use to inject insulin  daily Nichols, Tonya S, NP  Active   Lancet Device MISC [id number]  Use to check blood sugar twice daily. May substitute to any manufacturer covered by patient's insurance. [name], NP  Active   losartan -hydrochlorothiazide  (HYZAAR ) 100-25 MG tablet [id number]  Take 1 tablet by mouth daily. For blood pressure.  Patient not taking: Reported on 03/21/2024   [name], NP  Active   metFORMIN  (GLUCOPHAGE -XR) 500 MG 24 hr tablet [id number]  Take 1 tablet (500 mg total) by mouth 2 (two) times daily with a  meal. For diabetes.  Patient not taking: Reported on 03/21/2024   [name], NP  Active   ondansetron  (ZOFRAN -ODT) 4 MG disintegrating tablet [id number]  Take 1 tablet (4 mg total) by mouth every 8 (eight) hours as needed for nausea or vomiting. Rolinda Rogue, MD  Active               Assessment/Plan:   Diabetes: - Currently uncontrolled with most recent A1C of 7.3% above goal <7% and improved from 9.7%. Medication adherence appears inappropriate. I think she has only been taking Trulicity  and Lantus  for DM. It is too soon to recheck A1C today. Will assist in obtaining refills for patient at [facility] pharmacy and instructed her to bring glucometer and all medications to next appt on 04/05/24. Appears she is tolerating Trulicity  1.5 mg well for several months, so will plan to increase to 3 mg at next fill. - Last UACR April 2025 - 9 mg/g - Reviewed long term cardiovascular and renal outcomes of uncontrolled blood sugar - Reviewed goal A1c, goal fasting, and goal 2 hour post prandial glucose - Reviewed hypoglycemia management plan and the rule of 15 - Reviewed dietary modifications  including  utilizing the healthy plate method, limiting portion size of carbohydrate foods, increasing intake of protein and non-starchy vegetables. Counseled patient to stay hydrated with water throughout the day. - Reviewed lifestyle modifications including: aiming for 150 minutes of moderate intensity exercise every week.  - Recommend to continue Lantus  16 units daily, metformin  XR 500 mg BID - Recommend to INCREASE Trulicity  to 3 mg once weekly  - Recommend to check glucose twice daily: fasting and 2-hr PPG . Counseled patient to bring glucometer or BG log to every appointment. - Next A1C due October 2025 (at next PCP appt)      Hypertension: - Currently uncontrolled with clinic BP consistently above goal less than 130/80. Patient is not having s/sx of hypo- or hyper-tension. Medication adherence appears inappropriate.  - Reviewed long term cardiovascular and renal outcomes of uncontrolled blood pressure - Recommend to continue amlodipine  5 mg daily, losartan -hydrochlorothiazide  100-25 mg daily  - Will assist in obtaining refills today    Hyperlipidemia/ASCVD Risk Reduction: - Currently uncontrolled with most recent LDL-C of 97 mg/dL above goal < 70 mg/dL given U7IF + comorbitidies. Moderate intensity statin indicated. - Reviewed long term complications of uncontrolled cholesterol - Recommend to start atorvastatin 20 mg daily    Called [facility] to confirm medication prices. They said total copay was $59 for 30 ds of medications ($20 forLantus  and $25 foTrulicity ) but they were only running through one of her insurance plans (thought it was a Medicare plan). They did also have Medicaid on file, but insurance processing was down. Will try to call back tomorrow to confirm they are able to run medications for 90 ds through Medicaid for $4 copays.   Written patient instructions provided. Patient verbalized understanding of treatment plan.   Follow Up Plan:  Pharmacist in person  05/16/24 PCP clinic visit and pharmacist in 04/05/24   Lorain Baseman, PharmD [facility] [phone]

## 2024-03-21 NOTE — Patient Instructions (Addendum)
 Ilikuwa nzuri belarus leo!  Lengo lako la sukari ya damu ni 80-130 kabla ya kula na chini ya 180 baada maldives. Lengo lako la shinikizo la damu ni chini ya 130/80 mmHg.  Mabadiliko ya Dawa:  Trulicity  3 mg mara moja kwa wiki siku za [place] kwa sukari ya damu (kisukari)  Lantus  (insulin  glargine) - ingiza vitengo 16 kila siku kwa sukari ya damu (kisukari)  Metformin  XR kuchukua 500 mg (kibao 1) mara mbili kwa siku kwa sukari ya damu (kisukari)  Amlodipine  kuchukua 5 mg (kibao 1) mara moja kwa siku kwa shinikizo la damu  Losartan -hydrochlorothiazide  100-25 mg (kibao 1) mara moja kwa siku kwa shinikizo la damu  Atorvastatin 20 mg (kibao 1) kila siku kwa cholesterol  Hizi zitakuwa [facility] ili uchukue  Fuatilia sukari ya damu nyumbani na uweke logi (glukometa au kipande cha karatasi) ili uje nawe kwenye ziara yako inayofuata.  Endelea na kazi nzuri na lishe na mazoezi. Lengo la chakula kilichojaa mboga [place], matunda na nyama konda ([place], [place] Jaribu kupunguza ulaji wa chumvi kwa kula mboga mpya au zilizogandishwa (badala ya [employer]), suuza mboga za makopo kabla ya kupika na usiongeze chumvi yoyote kwenye milo.   Lorain Baseman, PharmD [facility] lovely [place] patricia Davene [phone]  __________________________________________________________   It was nice to see you today!  Your goal blood sugar is 80-130 before eating and less than 180 after eating. Your goal blood pressure is less than 130/80 mmHg.  Medication Changes:  Trulicity  3 mg once weekly on Saturdays for blood sugar (diabetes)  Lantus  (insulin  glargine) - inject 16 units daily for blood sugar (diabetes)  Metformin  XR take 500 mg (1 tablet) twice daily for for blood sugar (diabetes)  Amlodipine  take 5 mg (1 tablet) once daily for blood pressure  Losartan -hydrochlorothiazide  100-25 mg (1 tablet) once daily for blood pressure  Atorvastatin 20 mg (1 tablet) daily for cholesterol  These will be at  [facility] for you to pick up  Monitor blood sugars at home and keep a log (glucometer or piece of paper) to bring with you to your next visit.  Keep up the good work with diet and exercise. Aim for a diet full of vegetables, fruit and lean meats (chicken, [place], fish). Try to limit salt intake by eating fresh or frozen vegetables (instead of canned), rinse canned vegetables prior to cooking and do not add any additional salt to meals.   Lorain Baseman, PharmD [facility] [phone]

## 2024-03-22 ENCOUNTER — Telehealth: Payer: Self-pay | Admitting: Pharmacy Technician

## 2024-03-22 NOTE — Progress Notes (Addendum)
 Contacted patient who confirmed that she picked up her medications from [facility] today. Informed her that her Medicaid has expired. Attempted to communicated that she could re-enroll at [facility] office on 4th floor of [facility], but patient was unable to write down this address or instructions due to language barrier. She said that someone could help her understand if we sent it in the mail.   I also contacted congregational nurse, [name], to see if there were any other resources available to the patient to assist with Medicaid re-enrollment. Dororthy stated she can outreach to help with re-enrollment as well.   Total cost of medications this month was ~$59. She got a 3 mo supply of insulin , so next month, anticipate total cost to be ~$39. Total cost would be further lowered if she is re-approved for Medicaid. Will follow-up with patient in ~2 weeks at PCP appt.   Lorain Baseman, PharmD [facility] [phone]

## 2024-03-22 NOTE — Progress Notes (Signed)
 03/22/2024 Name: Anna Stephens MRN: [id number] DOB: 08-09-1965  Patient is appearing on a report for [facility] [place] [facility] and last engaged with the clinical pharmacist to discuss diabetes on 03/21/2024. Contacted patient today to discuss diabetes management and completed medication review.   Diabetes Plan from last clinical pharmacist appointment:  Diabetes: - Currently uncontrolled with most recent A1C of 7.3% above goal <7% and improved from 9.7%. Medication adherence appears inappropriate. I think she has only been taking Trulicity  and Lantus  for DM. It is too soon to recheck A1C today. Will assist in obtaining refills for patient at [facility] pharmacy and instructed her to bring glucometer and all medications to next appt on 04/05/24. Appears she is tolerating Trulicity  1.5 mg well for several months, so will plan to increase to 3 mg at next fill. - Last UACR April 2025 - 9 mg/g - Reviewed long term cardiovascular and renal outcomes of uncontrolled blood sugar - Reviewed goal A1c, goal fasting, and goal 2 hour post prandial glucose - Reviewed hypoglycemia management plan and the rule of 15 - Reviewed dietary modifications including  utilizing the healthy plate method, limiting portion size of carbohydrate foods, increasing intake of protein and non-starchy vegetables. Counseled patient to stay hydrated with water throughout the day. - Reviewed lifestyle modifications including: aiming for 150 minutes of moderate intensity exercise every week.  - Recommend to continue Lantus  16 units daily, metformin  XR 500 mg BID - Recommend to INCREASE Trulicity  to 3 mg once weekly  - Recommend to check glucose twice daily: fasting and 2-hr PPG . Counseled patient to bring glucometer or BG log to every appointment. - Next A1C due October 2025 (at next PCP appt)    Hypertension: - Currently uncontrolled with clinic BP consistently above goal less than 130/80. Patient is not having s/sx of hypo- or  hyper-tension. Medication adherence appears inappropriate.  - Reviewed long term cardiovascular and renal outcomes of uncontrolled blood pressure - Recommend to continue amlodipine  5 mg daily, losartan -hydrochlorothiazide  100-25 mg daily  - Will assist in obtaining refills today  Hyperlipidemia/ASCVD Risk Reduction: - Currently uncontrolled with most recent LDL-C of 97 mg/dL above goal < 70 mg/dL given U7IF + comorbitidies. Moderate intensity statin indicated. - Reviewed long term complications of uncontrolled cholesterol - Recommend to start atorvastatin 20 mg daily    Called [facility] to confirm medication prices. They said total copay was $59 for 30 ds of medications ($20 forLantus  and $25 foTrulicity ) but they were only running through one of her insurance plans (thought it was a Medicare plan). They did also have Medicaid on file, but insurance processing was down. Will try to call back tomorrow to confirm they are able to run medications for 90 ds through Medicaid for $4 copays.  Written patient instructions provided. Patient verbalized understanding of treatment plan.    Follow Up Plan:  Pharmacist in person 05/16/24 PCP clinic visit and pharmacist in 04/05/24     Medication Adherence Barriers Identified:  Access issues with any new medication or testing device: Yes Patient was out of most of her maintenance medications as noted above(Trulicity , Lantus , Amlodipine , Atorvastatin, Metformin  and Losartan -hydrochlorothiazide ) . Was asked to call [facility] by PharmD to confirm prescriptions were billed to [facility]. Called pharmacy and per pharmacy staff the [facility] card is rejecting for filled after coverage expired. The total cost for the medications are $84.50 with $50 of that total being Trulicity  and $20 beinLantus . This amount was different than the total given  to PharmD yesterday.  Was able to obtain a Trulicity  coupon for the patient. The BIN number is [id number]. The PCN number  is  [id number]. The ID number is [id number]. The Group number is [id number].Called pharmacy back and provided the information which brought the cost to Trulicity  down to $25. Now the total cost for all the medications called in yesterday is $59.50. PharmD was sent this information in an in basket message.   Medication Adherence Barriers Addressed/Actions Taken:  Reviewed medication changes per plan from last clinical pharmacist note Medication Access for Maintenance meds (Trulicity , Lantus , Amlodipine , Atorvastatin, Metformin  and Losartan -hydrochlorothiazide ) Will discuss medication access concerns with pharmacist Contacted pharmacy regarding new prescriptions  Next clinical pharmacist appointment is scheduled for: 04/05/2024  Kate Caddy, CPhT [facility] Office: [phone] Email: [email or web address]

## 2024-04-05 ENCOUNTER — Ambulatory Visit: Payer: Self-pay

## 2024-04-05 ENCOUNTER — Telehealth: Payer: Self-pay

## 2024-04-05 ENCOUNTER — Encounter: Payer: Self-pay | Admitting: Nurse Practitioner

## 2024-04-05 ENCOUNTER — Ambulatory Visit: Payer: Self-pay | Admitting: Nurse Practitioner

## 2024-04-05 NOTE — Progress Notes (Deleted)
 04/05/2024 Name: Anna Stephens MRN: [id number] DOB: 03-25-1966  No chief complaint on file.   Anna Stephens is a 58 y.o. year old female who was referred for medication management by their primary care provider, [name], NP. They presented for a face to face visit today. She is accompanied by a [facility] interpreter, Mardeen.   They were referred to the pharmacist by their PCP for assistance in managing diabetes . PMH includes HTN, T2DM (hx of HHS in 2023), BMI > 30.   Subjective: Patient was last seen by PCP, Bascom Oley, NP, on 01/03/24. At last visit, BP was 132/88 mmHg, HR 86. A1C had improved from 9.7% to 7.3%.  Today, patient presents in  good spirits and presents without  any assistance. She did not bring her medications or glucometer with her. She reports she is out of most of her medications (took her last dose of Trulicity  this past Saturday). Per fill hx, BP medications and metformin  not filled since July. She reports that Anna Stephens) was previously helping her obtain medications, but she is also able to get to [facility] pharmacy easily to pick up her medications herself. She states that she did take a pill this morning, but she is not sure what it was for.  Care Team: Primary Care Provider: [name], NP ; Next Scheduled Visit: 04/05/24   Medication Access/Adherence  Current Pharmacy:  [id number] - [place] ([place]), [place] - [address] [place]) [place] [address] Phone: [phone] Fax: [phone]   Patient reports affordability concerns with their medications: No  - confirms that $24 for 3 mo supply of medications (Medicaid copays) is affordable for her  Patient reports access/transportation concerns to their pharmacy: Yes  - but she is able to get to [facility] easily to pick up medications. Was previously using [facility] pharmacy with help of Dorothy, Engineer, water, but Anna reports  patient does not consistently communicate when refills are needed so she would prefer us  to send her medications to [facility].  Patient reports adherence concerns with their medications:  Yes  - per fill hx and patient report   Diabetes:  Current medications: metformin  XR 500 mg BID (not filled since 12/27/23), Trulicity  1.5 mg weekly (Saturdays - took last dose in box a few days ago), Lantus  16 units daily (confirms taking daily)  Denies n/v, abdominal pain, constipation with Trulicity .  Current glucose readings: Accu Chek Guide Meter - checking once daily fasting - Unable to recall readings. She lists numbers like 21 and 32, but I do not think this is accurate.  Patient denies hypoglycemic s/sx including dizziness, shakiness, sweating. Patient reports hyperglycemic symptoms including polyuria, polydipsia, nocturia. Denies neuropathy blurred vision.  Current meal patterns: Reports she does not get very hungry, typically eats twice per day. - sometimes eats fufu. Reports that she eats vegetables including spinach, green cassova. Reports eating protein like ground meat, beef.  - Drinks: Mainly drinks water, occasionally has soda (once/month)  Current physical activity: Reports that she walks a lot  Current medication access support: Medicaid   Hypertension:  Current medications: amlodipine  5 mg daily (not filled since 12/27/23), losartan -hydrochlorothiazide  100-25 mg daily (not filled since 12/27/23), Lantus  16 units daily (not filled since 12/27/23) Medications previously tried:   Patient has an automated, upper arm home BP cuff - states she is not using  Patient denies hypotensive s/sx including dizziness, lightheadedness.  Patient denies hypertensive symptoms including headache, chest pain, shortness  of breath  Hyperlipidemia/ASCVD Risk Reduction  Current lipid lowering medications: none  Objective:  BP Readings from Last 3 Encounters:  03/21/24 (!) 160/94  01/03/24 132/88   10/04/23 (!) 156/104    Lab Results  Component Value Date   HGBA1C 7.3 (A) 01/03/2024   HGBA1C 212 12/29/2023   HGBA1C 9.7 (A) 10/04/2023       Latest Ref Rng & Units 01/03/2024   11:15 AM 07/05/2023   11:16 AM 04/01/2023   11:07 AM  BMP  Glucose 70 - 99 mg/dL 743  690  650   BUN 6 - 24 mg/dL 10  11  13    Creatinine 0.57 - 1.00 mg/dL 9.28  9.42  9.24   BUN/Creat Ratio 9 - 23 14  19  17    Sodium 134 - 144 mmol/L 140  139  136   Potassium 3.5 - 5.2 mmol/L 3.9  4.2  4.3   Chloride 96 - 106 mmol/L 103  104  97   CO2 20 - 29 mmol/L 23  24  26    Calcium 8.7 - 10.2 mg/dL 8.9  8.5  9.4     Lab Results  Component Value Date   CHOL 170 04/01/2023   HDL 50 04/01/2023   LDLCALC 97 04/01/2023   TRIG 131 04/01/2023   CHOLHDL 3.4 04/01/2023    Medications Reviewed Today   Medications were not reviewed in this encounter       Assessment/Plan:   Diabetes: - Currently uncontrolled with most recent A1C of 7.3% above goal <7% and improved from 9.7%. Medication adherence appears inappropriate. I think she has only been taking Trulicity  and Lantus  for DM. It is too soon to recheck A1C today. Will assist in obtaining refills for patient at [facility] pharmacy and instructed her to bring glucometer and all medications to next appt on 04/05/24. Appears she is tolerating Trulicity  1.5 mg well for several months, so will plan to increase to 3 mg at next fill. - Last UACR April 2025 - 9 mg/g - Reviewed long term cardiovascular and renal outcomes of uncontrolled blood sugar - Reviewed goal A1c, goal fasting, and goal 2 hour post prandial glucose - Reviewed hypoglycemia management plan and the rule of 15 - Reviewed dietary modifications including  utilizing the healthy plate method, limiting portion size of carbohydrate foods, increasing intake of protein and non-starchy vegetables. Counseled patient to stay hydrated with water throughout the day. - Reviewed lifestyle modifications including:  aiming for 150 minutes of moderate intensity exercise every week.  - Recommend to continue Lantus  16 units daily, metformin  XR 500 mg BID - Recommend to INCREASE Trulicity  to 3 mg once weekly  - Recommend to check glucose twice daily: fasting and 2-hr PPG . Counseled patient to bring glucometer or BG log to every appointment. - Next A1C due October 2025 (at next PCP appt)      Hypertension: - Currently uncontrolled with clinic BP consistently above goal less than 130/80. Patient is not having s/sx of hypo- or hyper-tension. Medication adherence appears inappropriate.  - Reviewed long term cardiovascular and renal outcomes of uncontrolled blood pressure - Recommend to continue amlodipine  5 mg daily, losartan -hydrochlorothiazide  100-25 mg daily  - Will assist in obtaining refills today    Hyperlipidemia/ASCVD Risk Reduction: - Currently uncontrolled with most recent LDL-C of 97 mg/dL above goal < 70 mg/dL given U7IF + comorbitidies. Moderate intensity statin indicated. - Reviewed long term complications of uncontrolled cholesterol - Recommend to start atorvastatin 20 mg daily  Called [facility] to confirm medication prices. They said total copay was $59 for 30 ds of medications ($20 forLantus  and $25 foTrulicity ) but they were only running through one of her insurance plans (thought it was a Medicare plan). They did also have Medicaid on file, but insurance processing was down. Will try to call back tomorrow to confirm they are able to run medications for 90 ds through Medicaid for $4 copays.   Written patient instructions provided. Patient verbalized understanding of treatment plan.   Follow Up Plan:  Pharmacist in person 05/16/24 PCP clinic visit and pharmacist in 04/05/24   Lorain Baseman, PharmD [facility] [phone]

## 2024-04-05 NOTE — Telephone Encounter (Signed)
 I called patient yesterday with appointment reminders. Patient voiced concerns regarding lack of medicaid and did not want to go for the appointments. She has been to DSS for re-enrollment and I will follow up again today with the case managers at [facility].  She sent me pictures of blood sugar readings  03/28/24 = 232  03/31/24=132  Dartanion Teo RN BSN PCCN  [facility] [phone] [phone]-office

## 2024-04-07 ENCOUNTER — Telehealth: Payer: Self-pay

## 2024-04-07 NOTE — Telephone Encounter (Signed)
 Patient informed that her medicaid is for family planning only. Other coverages were denied due to income.Advised to use work related health insurance.  [name] Jeffrie Lofstrom RN BSN PCCN  [facility] [phone] [phone]-office

## 2024-05-15 NOTE — Congregational Nurse Program (Signed)
  Dept: [phone]   Congregational Nurse Program Note  Date of Encounter: 05/15/2024  Past Medical History: Past Medical History:  Diagnosis Date   Diabetes mellitus without complication (HCC)    Hypertension     Encounter Details:  Community Questionnaire - 05/15/24 1415       Questionnaire   Ask client: Do you give verbal consent for me to treat you today? Yes    Student Assistance N/A    Location Patient Served  [facility]    Encounter Setting Home    Population Status Migrant/Refugee    Insurance Medicaid    Insurance/Financial Assistance Referral N/A    Medication Have Medication Insecurities;Patient Medications Reviewed    Medical Provider Yes    Screening Referrals Made N/A    Medical Referrals Made [facility] PCP[facility]Clinic    Medical Appointment Completed [facility] PCP[facility] PCP/Clinic    CNP Interventions Counsel;Advocate/Support;Educate;Navigate Healthcare System;Case Management    Screenings [facility] Performed N/A    ED Visit Averted Yes    Life-Saving Intervention Made N/A         Home visit done- Family experiencing food insecurity, no longer receiving SNAP. Provided with 50 lbs Rice donation for [employer]. Noted that the ceiling is moldy from water leaking from upstairs. Patient has been requesting for repairs but nothing has been done yet. Patient provided with a written note to take to leasing office.  Reviewed all medications. Patient is compliant with her medications. Informed patient of the appointment scheduled for 12.2.24 at 0900. Patient reminded to carry her medicines for the appointment. Transportation assistance will be provided.   [name] Sahid Borba RN BSN PCCN  [facility] [phone]-cell [phone]-office

## 2024-05-16 ENCOUNTER — Ambulatory Visit (INDEPENDENT_AMBULATORY_CARE_PROVIDER_SITE_OTHER): Payer: Self-pay

## 2024-05-16 ENCOUNTER — Telehealth: Payer: Self-pay | Admitting: Pharmacy Technician

## 2024-05-16 VITALS — BP 140/83 | HR 91

## 2024-05-16 DIAGNOSIS — E1165 Type 2 diabetes mellitus with hyperglycemia: Secondary | ICD-10-CM

## 2024-05-16 LAB — POCT GLYCOSYLATED HEMOGLOBIN (HGB A1C): HbA1c, POC (controlled diabetic range): 8.5 % — AB (ref 0.0–7.0)

## 2024-05-16 MED ORDER — BLOOD GLUCOSE TEST VI STRP: ORAL_STRIP | 3 refills | Status: DC

## 2024-05-16 NOTE — Progress Notes (Signed)
 05/16/2024 Name: Anna Stephens MRN: [id number] DOB: Aug 11, 1965  Chief Complaint  Patient presents with   Diabetes   Hypertension   Hyperlipidemia    Anna Stephens is a 58 y.o. year old female who was referred for medication management by their primary care provider, Anna Bascom RAMAN, NP. They presented for a face to face visit today. She is accompanied by a [facility] interpreter.   They were referred to the pharmacist by their PCP for assistance in managing diabetes . PMH includes HTN, T2DM (hx of HHS in 2023), BMI > 30.   Subjective: Patient was last seen by PCP, Bascom Oley, NP, on 01/03/24. At last visit, BP was 132/88 mmHg, HR 86. A1C had improved from 9.7% to 7.3%. At pharmacy appt in person on 03/21/24, noted adherence concerns based on fill hx. Assisted patient in requesting refills of maintenance medications, which had to be run through commercial plan since her Medicaid expired. Since this visit, it was confirmed that she is no longer eligible for Medicaid based on income.   Today, patient presents in good spirits without assistance. She brought her medication bottles and glucometer. Meds were filled 03/21/24 for 30 ds, but she still has ~7-15 pills remaining in each bottle indicating lack of adherence. She states she is taking Lantus  7 units daily and Trulicity  every Sunday, and states she just picked up a new box. She needs refill of Accu Chek test strips.   Care Team: Primary Care Provider: [name], NP ; Next Scheduled Visit: needs to be scheduled   Medication Access/Adherence  Current Pharmacy:  [id number] - [place] - [address] [place] ([place]) [place] [address] Phone: [phone] Fax: [phone]   Patient reports affordability concerns with their medications: No  - confirms that $25 per mo for Trulicity  is affordable. Anticipate monthly cost of medications to be $39 unless she is picking up insulin   ($20 every 3 mo) which would make it $59 total.  Patient reports access/transportation concerns to their pharmacy: Yes  - but she is able to get to [facility] to pick up medications. Was previously using [facility] pharmacy with help of Dorothy, engineer, water, but [name] reports patient does not consistently communicate when refills are needed so she would prefer us  to send her medications to [facility].  Patient reports adherence concerns with their medications:  Yes  - per fill hx and patient report. Reports she takes her medications around 11AM when leaving for work, but is sometimes rushing and forgets. Brought pill bottles with her today. States she has Trulicity  and Lantus  at home.  Diabetes:  Current medications: metformin  XR 500 mg BID (taking once daily), Trulicity  3 mg weekly (appears 1.5 mg dose was filled last month, takes on Sundays), Lantus  16 units daily (reports taking 7 units daily in the morning)  Denies n/v, abdominal pain, constipation with Trulicity .  Current glucose readings: Accu Chek Guide Meter - checking ~once daily 05/15/24: 137 mg/dL (3PM) 88/73/74: 716 mg/dL (3PM) 88/75/74: 823 mg/dL (8:69EF) 88/2/74: 693 mg/dL (7:69EF) 88/4/74: 671 mg/dL (8:59EF) 89/69/74: 732 mg/dL (7:69EF) 89/79/74: 783 mg/dL (2PM) 89/82/74: 867 mg/dL (87EF)   Patient denies hypoglycemic s/sx including dizziness, shakiness, sweating. Patient reports hyperglycemic symptoms including polyuria, polydipsia, nocturia. Denies neuropathy blurred vision.  Current meal patterns: Reports she does not get very hungry, typically eats twice per day. - sometimes eats fufu. Reports that she eats vegetables including spinach, green cassova. Reports eating protein like ground meat,  beef.  - Drinks: Mainly drinks water, occasionally has soda (once/month)  Current physical activity: Reports that she walks a lot  Current medication access support: commercial insurance   Hypertension:  Current  medications: amlodipine  5 mg daily, losartan -hydrochlorothiazide  100-25 mg daily  Patient has an automated, upper arm home BP cuff - states she has checked at home, but does not recall the numbers  Patient reports hypotensive s/sx including dizziness, lightheadedness occasionally at work.  Patient denies hypertensive symptoms including headache, chest pain, shortness of breath  Hyperlipidemia/ASCVD Risk Reduction  Current lipid lowering medications: atorvatsatin 20 mg daily  Objective:  BP Readings from Last 3 Encounters:  05/16/24 (!) 140/83  03/21/24 (!) 160/94  01/03/24 132/88    Lab Results  Component Value Date   HGBA1C 8.5 (A) 05/16/2024   HGBA1C 7.3 (A) 01/03/2024   HGBA1C 212 12/29/2023       Latest Ref Rng & Units 01/03/2024   11:15 AM 07/05/2023   11:16 AM 04/01/2023   11:07 AM  BMP  Glucose 70 - 99 mg/dL 743  690  650   BUN 6 - 24 mg/dL 10  11  13    Creatinine 0.57 - 1.00 mg/dL 9.28  9.42  9.24   BUN/Creat Ratio 9 - 23 14  19  17    Sodium 134 - 144 mmol/L 140  139  136   Potassium 3.5 - 5.2 mmol/L 3.9  4.2  4.3   Chloride 96 - 106 mmol/L 103  104  97   CO2 20 - 29 mmol/L 23  24  26    Calcium  8.7 - 10.2 mg/dL 8.9  8.5  9.4     Lab Results  Component Value Date   CHOL 170 04/01/2023   HDL 50 04/01/2023   LDLCALC 97 04/01/2023   TRIG 131 04/01/2023   CHOLHDL 3.4 04/01/2023    Medications Reviewed Today     Reviewed by Brinda Lorain SQUIBB, RPH-CPP (Pharmacist) on 05/16/24 at [phone]  Med List Status: <None>   Medication Order Taking? Sig Documenting Provider Last Dose Status Informant  Accu-Chek Softclix Lancets lancets [id number]  Use as instructed Rolinda Rogue, MD  Active   Accu-Chek Softclix Lancets lancets [id number]  Use to check blood sugar twice daily. Anna Bascom RAMAN, NP  Active   amLODipine  (NORVASC ) 5 MG tablet [id number] Yes Take 1 tablet (5 mg total) by mouth daily. For blood pressure.  Patient taking differently: Take 1 tablet (5 mg total) by mouth  daily. For blood pressure.   Anna Bascom RAMAN, NP  Active   atorvastatin  (LIPITOR) 20 MG tablet [id number] Yes Take 1 tablet (20 mg total) by mouth daily. Anna Bascom RAMAN, NP  Active   Blood Glucose Monitoring Suppl (ACCU-CHEK GUIDE) w/Device KIT [id number]  Use to check blood sugar twice daily. Anna Bascom RAMAN, NP  Active   Dulaglutide  (TRULICITY ) 3 MG/0.5ML EMMANUEL [id number] Yes Inject 3 mg into the skin once a week.  Patient taking differently: Inject 3 mg into the skin once a week.   Anna Bascom RAMAN, NP  Active   Glucose Blood (BLOOD GLUCOSE TEST STRIPS) STRP [id number]  Use to check blood sugar up to three times daily Nichols, Tonya S, NP  Active   insulin  glargine (LANTUS  SOLOSTAR) 100 UNIT/ML Solostar Pen [id number] Yes Inject 16 Units into the skin daily. May increase up to 20 units daily if instructed by your doctor.  Patient taking differently: Inject 16 Units into the skin daily. May increase up to 20  units daily if instructed by your doctor.   Anna Bascom RAMAN, NP  Active   Insulin  Pen Needle (PEN NEEDLES) 32G X 4 MM MISC [id number]  Use to inject insulin  daily Nichols, Tonya S, NP  Active   Lancet Device MISC [id number]  Use to check blood sugar twice daily. May substitute to any manufacturer covered by patient's insurance. Anna Bascom RAMAN, NP  Active   losartan -hydrochlorothiazide  (HYZAAR ) 100-25 MG tablet [id number] Yes Take 1 tablet by mouth daily. For blood pressure.  Patient taking differently: Take 1 tablet by mouth daily. For blood pressure.   Anna Bascom RAMAN, NP  Active   metFORMIN  (GLUCOPHAGE -XR) 500 MG 24 hr tablet [id number] Yes Take 1 tablet (500 mg total) by mouth 2 (two) times daily with a meal. For diabetes.  Patient taking differently: Take 1 tablet (500 mg total) by mouth 2 (two) times daily with a meal. For diabetes.   Anna Bascom RAMAN, NP  Active   ondansetron  (ZOFRAN -ODT) 4 MG disintegrating tablet [id number]  Take 1 tablet (4 mg total) by mouth every 8 (eight) hours as  needed for nausea or vomiting. Rolinda Rogue, MD  Active               Assessment/Plan:   Diabetes: - Currently uncontrolled with A1C today of 8.5% above goal <7% and worsened from 7.3%, likely related to suboptimal medication adherence. Discussed methods for improving adherence including possibly switching dosing to evening when she gets home from work, and taking both tablets of metformin  at once. Will ask population health pharmacy technician for assistance in ensuring medications are filled at her preferred pharmacy.   - Last UACR April 2025 - 9 mg/g - Reviewed long term cardiovascular and renal outcomes of uncontrolled blood sugar - Reviewed goal A1c, goal fasting, and goal 2 hour post prandial glucose - Reviewed hypoglycemia management plan and the rule of 15 - Reviewed dietary modifications including  utilizing the healthy plate method, limiting portion size of carbohydrate foods, increasing intake of protein and non-starchy vegetables. Counseled patient to stay hydrated with water throughout the day. - Reviewed lifestyle modifications including: aiming for 150 minutes of moderate intensity exercise every week.  - Recommend to increase Lantus  to 12 units daily - Recommend to take metformin  1000 mg once daily with a meal - Recommend to continue Trulicity  to 3 mg once weekly (appears 1.5 mg dose was filled last month, will ensure pharmacy fills 3 mg dose moving forward) - Recommend to check glucose twice daily: fasting and 2-hr PPG . Counseled patient to bring glucometer or BG log to every appointment. - Next A1C due March 2025    Hypertension: - Currently uncontrolled with clinic BP consistently above goal less than 130/80. Patient reports some dizziness at work, but BP has been elevated in clinic. Encouraged daily adherence and if the dizziness occurs more frequently with better adherence, then we can consider down-titrating one of her medications. - Reviewed long term  cardiovascular and renal outcomes of uncontrolled blood pressure - Recommend to continue amlodipine  5 mg daily, losartan -hydrochlorothiazide  100-25 mg daily     Hyperlipidemia/ASCVD Risk Reduction: - Currently uncontrolled with most recent LDL-C of 97 mg/dL above goal < 70 mg/dL given U7IF + comorbitidies. Moderate intensity statin indicated. Patient has bottle with her and is taking when she remembers. - Reviewed long term complications of uncontrolled cholesterol - Recommend to continue atorvastatin  20 mg daily   Written patient instructions provided. Patient verbalized understanding of treatment plan.   Follow  Up Plan:  Pharmacist in person 07/12/23 PCP clinic visit 06/23/23   Lorain Baseman, PharmD [facility] [phone]

## 2024-05-16 NOTE — Progress Notes (Signed)
 05/16/2024 Name: Anna Stephens MRN: [id number] DOB: 17-Jul-1965  Patient is appearing for a follow-up visit with the population health pharmacy technician. Last engaged with the clinical pharmacist to discuss diabetes on 05/16/2024. Contacted pharmacy  today to discuss medication access.   Plan from last clinical pharmacist appointment:  Diabetes: - Currently uncontrolled with A1C today of 8.5% above goal <7% and worsened from 7.3%, likely related to suboptimal medication adherence. Discussed methods for improving adherence including possibly switching dosing to evening when she gets home from work, and taking both tablets of metformin  at once. Will ask population health pharmacy technician for assistance in ensuring medications are filled at her preferred pharmacy.   - Last UACR April 2025 - 9 mg/g - Reviewed long term cardiovascular and renal outcomes of uncontrolled blood sugar - Reviewed goal A1c, goal fasting, and goal 2 hour post prandial glucose - Reviewed hypoglycemia management plan and the rule of 15 - Reviewed dietary modifications including  utilizing the healthy plate method, limiting portion size of carbohydrate foods, increasing intake of protein and non-starchy vegetables. Counseled patient to stay hydrated with water throughout the day. - Reviewed lifestyle modifications including: aiming for 150 minutes of moderate intensity exercise every week.  - Recommend to increase Lantus  to 12 units daily - Recommend to take metformin  1000 mg once daily with a meal - Recommend to continue Trulicity  to 3 mg once weekly (appears 1.5 mg dose was filled last month, will ensure pharmacy fills 3 mg dose moving forward) - Recommend to check glucose twice daily: fasting and 2-hr PPG . Counseled patient to bring glucometer or BG log to every appointment. - Next A1C due March 2025 Hypertension: - Currently uncontrolled with clinic BP consistently above goal less than 130/80. Patient reports some  dizziness at work, but BP has been elevated in clinic. Encouraged daily adherence and if the dizziness occurs more frequently with better adherence, then we can consider down-titrating one of her medications. - Reviewed long term cardiovascular and renal outcomes of uncontrolled blood pressure - Recommend to continue amlodipine  5 mg daily, losartan -hydrochlorothiazide  100-25 mg daily  Hyperlipidemia/ASCVD Risk Reduction: - Currently uncontrolled with most recent LDL-C of 97 mg/dL above goal < 70 mg/dL given U7IF + comorbitidies. Moderate intensity statin indicated. Patient has bottle with her and is taking when she remembers. - Reviewed long term complications of uncontrolled cholesterol - Recommend to continue atorvastatin  20 mg daily -Written patient instructions provided. Patient verbalized understanding of treatment plan.  -Follow Up Plan:  Pharmacist in person 07/12/23 PCP clinic visit 06/23/23(copy/paste from last note)   Medication Adherence Barriers Identified:  Access issues with any new medication or testing device: Yes Trulicity , Amlodipine , Atorvastatin , Losartan /hydrochlorothiazide , Metformin  and Test Strips   Medication Adherence Barriers Addressed/Actions Taken:  Reviewed medication changes per plan from last clinical pharmacist note Medication Access for Trulicity , Amlodipine , Atorvastatin , Losartan /Hydrochlorothiazide , Metformin  and Test Strips Will discuss medication access concerns with pharmacist Contacted pharmacy regarding new prescriptions Per [facility] pharmacy staff member, the claim for Trulicity  rejected for a month supply stating fill limits exceeded which per staff member means patient has exceed fill limits which probably means a PA is needed. Will request Pharmacist assistance in obtaining a PA. [facility] staff member was able to fill the test strips for a copay of $20. Amlodipine , Atorvastatin , Metformin  and Losartan /hydrochlorothiazide  were also filled but for  a 30 day supply as patient's insurance will only fill for 30 day supply not 90 day supply. The cost to the patient for these  4 medications is $14.50.   Next clinical pharmacist appointment is scheduled for: 07/12/2023  Lakashia Collison, CPhT [facility] Office: [phone] Email: [email or web address]

## 2024-05-16 NOTE — Patient Instructions (Addendum)
 Ilikuwa nzuri kukuona leo!  Lengo lako la sukari ya damu ni 80-130 mg/dL kabla ya kula na chini ya 180 mg/dL baada ya kula. Fuatilia sukari ya damu nyumbani na uweke logi (glukometa au kipande cha karatasi) ili uje nawe kwenye ziara yako inayofuata.  Mabadiliko ya Dawa: Ongeza metformin  hadi vidonge 2 (jumla ya 1000 mg) mara moja kwa siku  Ongeza Lantus  hadi vitengo 12 mara moja kwa siku  Endelea Trulicity  3 mg mara moja kila wiki siku za [place]  Endelea dawa zingine zote sawa.   Hannah unapata dalili za kupungua kwa sukari ya damu (kizunguzungu, [place], [place]) angalia sukari yako ya damu. Hannah ni chini ya 70 mg/dL, unapaswa kula au [place] gramu 15 za wanga inayofanya kazi [place]aka kama vile vidonge 4 vya glukosi, 1/2 kikombe cha soda ya kawaida, au 1/2 kikombe cha juisi. Angalia tena sukari yako ya damu baada ya dakika 15. Ikiwa kiwango bado ni chini ya 70 mg/dL kurudia mchakato. Ikiwa hii hutokea mara kwa [place], unapaswa kumjulisha mtoa [place] wa afya.  _______________________________________________________________________________  It was nice to see you today!  Your goal blood sugar is 80-130 mg/dL before eating and less than 180 mg/dL after eating. Monitor blood sugars at home and keep a log (glucometer or piece of paper) to bring with you to your next visit.  Medication Changes: Increase metformin  to 2 tablets (1000 mg total) once daily  Increase Lantus  to 12 units once daily  Continue Trulicity  3 mg once weekly on Sundays  Continue all other medication the same.   If you experience symptoms of a low blood sugar (dizzy, shaky, sweaty) check your blood sugar. If it is less than 70 mg/dL, you should eat or drink 15 grams of fast-acting carbohydrates like 4 glucose tablets, 1/2 cup of regular soda, or 1/2 cup of juice. Recheck your blood sugar after 15 minutes. If the level is still below 70 mg/dL repeat the process. If this occurs frequently, you should notify your  healthcare provider.   Lifestyle Recommendations:  Aim for 150 minutes of moderate intensity exercise every week. This is any activity that elevates your heart rate and breathing rate, but still allows you to carry on a conversation. Exercising after meals can help prevent spikes in your blood sugar.  Diet Recommendations:   Try to eat 3 real meals using the healthy plate method and 1-2 snacks per day. Never go more than 4-5 hours while awake without eating. Eat breakfast within the first hour of getting up.     Carbohydrates include starch, sugar, and fiber. Sugar and starch raise blood glucose.  Starchy foods include bread, rice, pasta, potatoes, corn, cereal, grits, crackers, bagels, muffins, all baked foods Some fruits are higher in sugar, like grapes, watermelon, oranges, and most tropical fruits. Limit your serving size to 1/2 cup at a time.  Protein foods include meat, fish, poultry, eggs, dairy, and beans (although beans also provide carbohydrates).  Non-starchy vegetables do not impact your blood sugar very much. These include greens, broccoli, asparagus, carrots, cauliflower, cucumber, mushrooms, peppers, yellow squash, zucchini squash, tomato, to name a few!  Avoid all sugary beverages. Stay hydrated with water throughout the day. Sparkling/flavored water, unsweet tea, black coffee, and zero-sugar or diet drinks will not impact your blood sugar, but they should not be used as your only source of hydration!  You can find more information at https://diabetes.org/food-nutrition/eating-healthy   Lorain Baseman, PharmD [facility] [phone]

## 2024-05-17 ENCOUNTER — Other Ambulatory Visit (HOSPITAL_COMMUNITY): Payer: Self-pay

## 2024-05-18 ENCOUNTER — Other Ambulatory Visit: Payer: Self-pay

## 2024-05-18 ENCOUNTER — Telehealth: Payer: Self-pay

## 2024-05-18 NOTE — Telephone Encounter (Signed)
 Pharmacy Patient Advocate Encounter  Received notification from [facility] that Prior Authorization for TRULICITY  has been APPROVED from 05/18/2024 to 05/18/2027   PA #/Case ID/Reference #: [id number]

## 2024-05-25 ENCOUNTER — Telehealth: Payer: Self-pay | Admitting: Pharmacy Technician

## 2024-05-25 NOTE — Progress Notes (Signed)
° °  05/25/2024 Name: Anna Stephens MRN: [id number] DOB: 25-Oct-1965  Patient is appearing for a follow-up visit with the population health pharmacy technician. Last engaged with the clinical pharmacist to discuss diabetes on 05/16/2024. Contacted pharmacy today to discuss medication access.   Plan from last clinical pharmacist appointment:  Diabetes: - Currently uncontrolled with A1C today of 8.5% above goal <7% and worsened from 7.3%, likely related to suboptimal medication adherence. Discussed methods for improving adherence including possibly switching dosing to evening when she gets home from work, and taking both tablets of metformin  at once. Will ask population health pharmacy technician for assistance in ensuring medications are filled at her preferred pharmacy.   - Last UACR April 2025 - 9 mg/g - Reviewed long term cardiovascular and renal outcomes of uncontrolled blood sugar - Reviewed goal A1c, goal fasting, and goal 2 hour post prandial glucose - Reviewed hypoglycemia management plan and the rule of 15 - Reviewed dietary modifications including  utilizing the healthy plate method, limiting portion size of carbohydrate foods, increasing intake of protein and non-starchy vegetables. Counseled patient to stay hydrated with water throughout the day. - Reviewed lifestyle modifications including: aiming for 150 minutes of moderate intensity exercise every week.  - Recommend to increase Lantus  to 12 units daily - Recommend to take metformin  1000 mg once daily with a meal - Recommend to continue Trulicity  to 3 mg once weekly (appears 1.5 mg dose was filled last month, will ensure pharmacy fills 3 mg dose moving forward) - Recommend to check glucose twice daily: fasting and 2-hr PPG . Counseled patient to bring glucometer or BG log to every appointment. - Next A1C due March 2025 Hypertension: - Currently uncontrolled with clinic BP consistently above goal less than 130/80. Patient reports some  dizziness at work, but BP has been elevated in clinic. Encouraged daily adherence and if the dizziness occurs more frequently with better adherence, then we can consider down-titrating one of her medications. - Reviewed long term cardiovascular and renal outcomes of uncontrolled blood pressure - Recommend to continue amlodipine  5 mg daily, losartan -hydrochlorothiazide  100-25 mg daily  Hyperlipidemia/ASCVD Risk Reduction: - Currently uncontrolled with most recent LDL-C of 97 mg/dL above goal < 70 mg/dL given U7IF + comorbitidies. Moderate intensity statin indicated. Patient has bottle with her and is taking when she remembers. - Reviewed long term complications of uncontrolled cholesterol - Recommend to continue atorvastatin  20 mg daily Written patient instructions provided. Patient verbalized understanding of treatment plan.  Follow Up Plan:  Pharmacist in person 07/12/23 PCP clinic visit 06/23/23(copy/paste from last note)   Medication Adherence Barriers Identified:  Access issues with any new medication or testing device: Yes Trulicity    Medication Adherence Barriers Addressed/Actions Taken:  Reviewed medication changes per plan from last clinical pharmacist note Medication Access for Trulicity  3mg  Will discuss medication access concerns with pharmacist Contacted pharmacy regarding new prescriptions Spoke to pharmacy staff, pharmacy tech was able to fill the Trulicity  3mg  for a cost of $25 to the patient. Advised to discontinued Trulicity  1.5mg  orders again as per pharmacist instruction. Patient will receive a text when ready to be picked up later today. Pharmacy tech also informs Amlodipine , Atorvastatin , Metformin , Losartan /Hydrochlorothiazide  and Accu Chek Strips were all picked up on 05/21/24.   Next clinical pharmacist appointment is scheduled for: 07/12/2023   Madelena Maturin, CPhT [facility] Office: [phone] Email: [email or web address]

## 2024-05-29 ENCOUNTER — Telehealth (HOSPITAL_COMMUNITY): Payer: Self-pay | Admitting: Pharmacy Technician

## 2024-05-29 ENCOUNTER — Other Ambulatory Visit (HOSPITAL_COMMUNITY): Payer: Self-pay

## 2024-05-29 ENCOUNTER — Telehealth: Payer: Self-pay | Admitting: Pharmacy Technician

## 2024-05-29 NOTE — Progress Notes (Signed)
° °  05/29/2024  Patient ID: Anna Stephens, female   DOB: 06/29/1965, 58 y.o.   MRN: [id number]  Patient engaged with clinical pharmacist for management of diabetes on 05/16/2024. Outreach by [employer] technician was requested.   Outreached patient to discuss diabetes medication management. With the help of [name] from [employer] (ID [phone]) we attempted to call patient twice. Both times the call would be answered, noises could be heard in the background, but no one on the other line would speak. No message was able to be left.  [name], CPhT [place] [facility] Office: [phone] Email: [email or web address]

## 2024-05-29 NOTE — Patient Outreach (Signed)
 Erroneous Encounter.  Enis Riecke, CPhT [place] [facility] Office: [phone] Email: [email or web address]

## 2024-05-30 LAB — GLUCOSE, POCT (MANUAL RESULT ENTRY): POC Glucose: 173 mg/dL — AB (ref 70–99)

## 2024-05-30 NOTE — Congregational Nurse Program (Signed)
°  Dept: [phone]   Congregational Nurse Program Note  Date of Encounter: 05/30/2024  Past Medical History: Past Medical History:  Diagnosis Date   Diabetes mellitus without complication (HCC)    Hypertension     Encounter Details:  Community Questionnaire - 05/30/24 0953       Questionnaire   Ask client: Do you give verbal consent for me to treat you today? Yes    Chiropractor    Location Patient Served  [facility]    Encounter Setting CN site    Population Status Migrant/Refugee    Insurance Medicaid    Insurance/Financial Assistance Referral N/A    Medication Have Medication Insecurities    Medical Provider Yes    Screening Referrals Made N/A    Medical Referrals Made N/A    Medical Appointment Completed [facility]    CNP Interventions Counsel;Advocate/Support;Educate;Navigate Healthcare System;Case Management    Screenings CN Performed Blood Pressure;Blood Glucose    ED Visit Averted N/A    Life-Saving Intervention Made N/A         Patient came in for routine blood sugar check. Blood sugar today is 173. Encouraged to be compliant with medication.   [name] Reet Scharrer RN BSN PCCN  [facility] [phone]-cell [phone]-office

## 2024-06-01 ENCOUNTER — Encounter (HOSPITAL_COMMUNITY): Payer: Self-pay | Admitting: Pharmacy Technician

## 2024-06-01 ENCOUNTER — Telehealth: Payer: Self-pay | Admitting: Pharmacy Technician

## 2024-06-01 NOTE — Progress Notes (Signed)
° °  06/01/2024  Patient ID: Anna Stephens, female   DOB: 11-20-1965, 58 y.o.   MRN: [id number]  Patient engaged with clinical pharmacist for management of diabetes on 05/16/2024. Outreach by [employer] technician was requested.   Outreached patient to discuss diabetes medication management. With the help of Mac from [employer] ([phone] [phone]), we attempted to call patient. A message was received that informs client is not available and there was no option to leave a voicemail.  Reveca Desmarais, CPhT [facility] Office: [phone] Email: [email or web address]

## 2024-06-01 NOTE — Patient Outreach (Signed)
 Erroneous Encounter.  Enis Riecke, CPhT [place] [facility] Office: [phone] Email: [email or web address]

## 2024-06-12 ENCOUNTER — Telehealth: Payer: Self-pay | Admitting: Pharmacy Technician

## 2024-06-12 NOTE — Progress Notes (Signed)
 "  06/12/2024 Name: Anna Stephens MRN: [id number] DOB: 07/18/1965  Patient is appearing for a follow-up visit with the population health pharmacy technician. Last engaged with the clinical pharmacist to discuss diabetes on 05/16/2024. Contacted patient today to discuss diabetes.   Plan from last clinical pharmacist appointment:  Diabetes: - Currently uncontrolled with A1C today of 8.5% above goal <7% and worsened from 7.3%, likely related to suboptimal medication adherence. Discussed methods for improving adherence including possibly switching dosing to evening when she gets home from work, and taking both tablets of metformin  at once. Will ask population health pharmacy technician for assistance in ensuring medications are filled at her preferred pharmacy.   - Last UACR April 2025 - 9 mg/g - Reviewed long term cardiovascular and renal outcomes of uncontrolled blood sugar - Reviewed goal A1c, goal fasting, and goal 2 hour post prandial glucose - Reviewed hypoglycemia management plan and the rule of 15 - Reviewed dietary modifications including  utilizing the healthy plate method, limiting portion size of carbohydrate foods, increasing intake of protein and non-starchy vegetables. Counseled patient to stay hydrated with water throughout the day. - Reviewed lifestyle modifications including: aiming for 150 minutes of moderate intensity exercise every week.  - Recommend to increase Lantus  to 12 units daily - Recommend to take metformin  1000 mg once daily with a meal - Recommend to continue Trulicity  to 3 mg once weekly (appears 1.5 mg dose was filled last month, will ensure pharmacy fills 3 mg dose moving forward) - Recommend to check glucose twice daily: fasting and 2-hr PPG . Counseled patient to bring glucometer or BG log to every appointment. - Next A1C due March 2025 Hypertension: - Currently uncontrolled with clinic BP consistently above goal less than 130/80. Patient reports some dizziness at  work, but BP has been elevated in clinic. Encouraged daily adherence and if the dizziness occurs more frequently with better adherence, then we can consider down-titrating one of her medications. - Reviewed long term cardiovascular and renal outcomes of uncontrolled blood pressure - Recommend to continue amlodipine  5 mg daily, losartan -hydrochlorothiazide  100-25 mg daily  Hyperlipidemia/ASCVD Risk Reduction: - Currently uncontrolled with most recent LDL-C of 97 mg/dL above goal < 70 mg/dL given U7IF + comorbitidies. Moderate intensity statin indicated. Patient has bottle with her and is taking when she remembers. - Reviewed long term complications of uncontrolled cholesterol - Recommend to continue atorvastatin  20 mg daily -Written patient instructions provided. Patient verbalized understanding of treatment plan.  -Follow Up Plan:  Pharmacist in person 07/12/23 PCP clinic visit 06/23/23(copy/paste from last note)   Medication Adherence Barriers Identified: Access issues with any new medication or testing device: Yes Trulicity , Amlodipine , Atorvastatin , Losartan /Hydrochlorothiazide , Metformin  and Test Strips   Medication Adherence Barriers Addressed/Actions Taken:  Medication Access foTrulicity, Amlodipine , Atorvastatin , Losartan /Hydrochlorothiazide , Metformin  and Test Strips  Will discuss medication access concerns with pharmacist Contacted pharmacy regarding new prescriptions Spoke to pharmacy staff at [facility] this morning. Trulicity  was picked up on 12/11. Amlodipine , Atorvastatin , Losartan /Hydrochlorothiazide , Metformin  and Test Strips were all picked up on 12/7.  Patient engaged with clinical pharmacist for management of diabetes on 05/16/2024. Outreach by [employer] technician was requested with the assistance of [employer] whose ID# is [phone]. Outreached patient to discuss diabetes medication management. No voicemail was able to be left as a factory message  came on stating wireless customer you are trying to reach is not available, please try your call again later and then the call disconnects.. Today was my 3rd unsuccessful  attempt in trying to reach patient.    Next clinical pharmacist appointment is scheduled for: 07/11/2024  Nera Haworth, CPhT [facility] Office: [phone] Email: [email or web address]   "

## 2024-06-22 ENCOUNTER — Telehealth: Payer: Self-pay

## 2024-06-22 ENCOUNTER — Encounter: Payer: Self-pay | Admitting: Nurse Practitioner

## 2024-06-22 ENCOUNTER — Ambulatory Visit (INDEPENDENT_AMBULATORY_CARE_PROVIDER_SITE_OTHER): Payer: Self-pay | Admitting: Nurse Practitioner

## 2024-06-22 VITALS — BP 137/99 | HR 86 | Wt 237.0 lb

## 2024-06-22 DIAGNOSIS — Z794 Long term (current) use of insulin: Secondary | ICD-10-CM

## 2024-06-22 DIAGNOSIS — I10 Essential (primary) hypertension: Secondary | ICD-10-CM

## 2024-06-22 DIAGNOSIS — E119 Type 2 diabetes mellitus without complications: Secondary | ICD-10-CM | POA: Diagnosis not present

## 2024-06-22 DIAGNOSIS — Z748 Other problems related to care provider dependency: Secondary | ICD-10-CM | POA: Diagnosis not present

## 2024-06-22 DIAGNOSIS — E1165 Type 2 diabetes mellitus with hyperglycemia: Secondary | ICD-10-CM

## 2024-06-22 DIAGNOSIS — Z1329 Encounter for screening for other suspected endocrine disorder: Secondary | ICD-10-CM | POA: Diagnosis not present

## 2024-06-22 MED ORDER — AMLODIPINE BESYLATE 5 MG PO TABS: 5.0000 mg | ORAL_TABLET | Freq: Every day | ORAL | 3 refills | Status: AC

## 2024-06-22 MED ORDER — ATORVASTATIN CALCIUM 20 MG PO TABS: 20.0000 mg | ORAL_TABLET | Freq: Every day | ORAL | 3 refills | Status: AC

## 2024-06-22 MED ORDER — PEN NEEDLES 32G X 4 MM MISC: 3 refills | Status: AC

## 2024-06-22 MED ORDER — TRULICITY 3 MG/0.5ML ~~LOC~~ SOAJ: 3.0000 mg | SUBCUTANEOUS | 5 refills | Status: AC

## 2024-06-22 MED ORDER — BLOOD GLUCOSE TEST VI STRP: ORAL_STRIP | 3 refills | Status: AC

## 2024-06-22 MED ORDER — LANTUS SOLOSTAR 100 UNIT/ML ~~LOC~~ SOPN: 16.0000 [IU] | PEN_INJECTOR | Freq: Every day | SUBCUTANEOUS | 3 refills | Status: AC

## 2024-06-22 MED ORDER — ACCU-CHEK SOFTCLIX LANCETS MISC: 3 refills | Status: AC

## 2024-06-22 MED ORDER — LOSARTAN POTASSIUM-HCTZ 100-25 MG PO TABS: 1.0000 | ORAL_TABLET | Freq: Every day | ORAL | 3 refills | Status: AC

## 2024-06-22 MED ORDER — METFORMIN HCL ER 500 MG PO TB24: 500.0000 mg | ORAL_TABLET | Freq: Two times a day (BID) | ORAL | 3 refills | Status: AC

## 2024-06-22 NOTE — Telephone Encounter (Signed)
 Patient called me requesting assistance with transportation. Gisele ride requested.  [name] Dionisia Pacholski RN BSN PCCN  [facility] [phone]-cell [phone]-office

## 2024-06-22 NOTE — Progress Notes (Addendum)
 "  Subjective   Patient ID: Anna Stephens, female    DOB: 09-15-1965, 59 y.o.   MRN: [id number]  Chief Complaint  Patient presents with   Diabetes   Hypertension    Referring provider: [name], NP  [facility] is a 59 y.o. female with Past Medical History: No date: Diabetes mellitus without complication (HCC) No date: Hypertension   HPI  Patient presents today for follow-up visit for diabetes, hypertension, hyperlipidemia.  Recent A1C was 8.5.  She does have an interpreter in the room with her.  Patient states that she is compliant with her medications. Pharmacy is following for medication management. Pharmacy is following for diabetic medication management. Denies f/c/s, n/v/d, hemoptysis, PND, leg swelling Denies chest pain or edema.  Note: Patient states that she is having issues with transportation.  Bus passes were given to her in office today.  Also a referral was placed for phone to [email or web address].  Information was given to the patient.  Allergies[1]  Immunization History  Administered Date(s) Administered   PNEUMOCOCCAL CONJUGATE-20 02/01/2022    Tobacco History: Tobacco Use History[2] Counseling given: Not Answered   Outpatient Encounter Medications as of 06/22/2024  Medication Sig   Accu-Chek Softclix Lancets lancets Use as instructed   Blood Glucose Monitoring Suppl (ACCU-CHEK GUIDE) w/Device KIT Use to check blood sugar twice daily.   Lancet Device MISC Use to check blood sugar twice daily. May substitute to any manufacturer covered by patient's insurance.   [DISCONTINUED] Accu-Chek Softclix Lancets lancets Use to check blood sugar twice daily.   [DISCONTINUED] amLODipine  (NORVASC ) 5 MG tablet Take 1 tablet (5 mg total) by mouth daily. For blood pressure.   [DISCONTINUED] atorvastatin  (LIPITOR) 20 MG tablet Take 1 tablet (20 mg total) by mouth daily.   [DISCONTINUED] Dulaglutide  (TRULICITY ) 3 MG/0.5ML SOAJ Inject 3 mg into the skin once a week.    [DISCONTINUED] Glucose Blood (BLOOD GLUCOSE TEST STRIPS) STRP Use to check blood sugar up to three times daily   [DISCONTINUED] insulin  glargine (LANTUS  SOLOSTAR) 100 UNIT/ML Solostar Pen Inject 16 Units into the skin daily. May increase up to 20 units daily if instructed by your doctor.   [DISCONTINUED] Insulin  Pen Needle (PEN NEEDLES) 32G X 4 MM MISC Use to inject insulin  daily   [DISCONTINUED] losartan -hydrochlorothiazide  (HYZAAR ) 100-25 MG tablet Take 1 tablet by mouth daily. For blood pressure.   [DISCONTINUED] metFORMIN  (GLUCOPHAGE -XR) 500 MG 24 hr tablet Take 1 tablet (500 mg total) by mouth 2 (two) times daily with a meal. For diabetes.   Accu-Chek Softclix Lancets lancets Use to check blood sugar twice daily.   amLODipine  (NORVASC ) 5 MG tablet Take 1 tablet (5 mg total) by mouth daily. For blood pressure.   atorvastatin  (LIPITOR) 20 MG tablet Take 1 tablet (20 mg total) by mouth daily.   Dulaglutide  (TRULICITY ) 3 MG/0.5ML SOAJ Inject 3 mg into the skin once a week.   Glucose Blood (BLOOD GLUCOSE TEST STRIPS) STRP Use to check blood sugar up to three times daily   insulin  glargine (LANTUS  SOLOSTAR) 100 UNIT/ML Solostar Pen Inject 16 Units into the skin daily. May increase up to 20 units daily if instructed by your doctor.   Insulin  Pen Needle (PEN NEEDLES) 32G X 4 MM MISC Use to inject insulin  daily   losartan -hydrochlorothiazide  (HYZAAR ) 100-25 MG tablet Take 1 tablet by mouth daily. For blood pressure.   metFORMIN  (GLUCOPHAGE -XR) 500 MG 24 hr tablet Take 1 tablet (500 mg total) by mouth 2 (two) times daily with  a meal. For diabetes.   ondansetron  (ZOFRAN -ODT) 4 MG disintegrating tablet Take 1 tablet (4 mg total) by mouth every 8 (eight) hours as needed for nausea or vomiting.   No facility-administered encounter medications on file as of 06/22/2024.    Review of Systems  Review of Systems  Constitutional: Negative.   HENT: Negative.    Cardiovascular: Negative.   Gastrointestinal:  Negative.   Allergic/Immunologic: Negative.   Neurological: Negative.   Psychiatric/Behavioral: Negative.       Objective:   BP (!) 137/99   Pulse 86   Wt 237 lb (107.5 kg)   SpO2 100%   BMI 45.52 kg/m   Wt Readings from Last 5 Encounters:  06/22/24 237 lb (107.5 kg)  03/21/24 238 lb 12.8 oz (108.3 kg)  01/03/24 237 lb (107.5 kg)  10/04/23 242 lb 3.2 oz (109.9 kg)  07/05/23 244 lb 9.6 oz (110.9 kg)     Physical Exam Vitals and nursing note reviewed.  Constitutional:      General: She is not in acute distress.    Appearance: She is well-developed.  Cardiovascular:     Rate and Rhythm: Normal rate and regular rhythm.  Pulmonary:     Effort: Pulmonary effort is normal.     Breath sounds: Normal breath sounds.  Neurological:     Mental Status: She is alert and oriented to person, place, and time.       Assessment & Plan:   Assistance needed with transportation  Primary hypertension -     CBC -     Comprehensive metabolic panel with GFR -     Lipid panel -     amLODIPine  Besylate; Take 1 tablet (5 mg total) by mouth daily. For blood pressure.  Dispense: 90 tablet; Refill: 3 -     Losartan  Potassium-HCTZ; Take 1 tablet by mouth daily. For blood pressure.  Dispense: 90 tablet; Refill: 3  Type 2 diabetes mellitus with hyperglycemia, unspecified whether long term insulin  use (HCC) -     CBC -     Comprehensive metabolic panel with GFR -     Lipid panel -     Blood Glucose Test; Use to check blood sugar up to three times daily  Dispense: 100 strip; Refill: 3  Thyroid disorder screen -     TSH  Type 2 diabetes mellitus without complication, with long-term current use of insulin  (HCC) -     Atorvastatin  Calcium ; Take 1 tablet (20 mg total) by mouth daily.  Dispense: 90 tablet; Refill: 3 -     Lantus  SoloStar; Inject 16 Units into the skin daily. May increase up to 20 units daily if instructed by your doctor.  Dispense: 18 mL; Refill: 3 -     Trulicity ; Inject 3 mg  into the skin once a week.  Dispense: 2 mL; Refill: 5 -     metFORMIN  HCl ER; Take 1 tablet (500 mg total) by mouth 2 (two) times daily with a meal. For diabetes.  Dispense: 180 tablet; Refill: 3  Other orders -     Accu-Chek Softclix Lancets; Use to check blood sugar twice daily.  Dispense: 100 each; Refill: 3 -     Pen Needles; Use to inject insulin  daily  Dispense: 100 each; Refill: 3     Return in about 3 months (around 09/20/2024).   Bascom GORMAN Borer, NP 06/22/2024     [1] No Known Allergies [2]  Social History Tobacco Use  Smoking Status Never  Smokeless  Tobacco Never   "

## 2024-06-23 LAB — COMPREHENSIVE METABOLIC PANEL WITH GFR
ALT: 19 IU/L (ref 0–32)
AST: 13 IU/L (ref 0–40)
Albumin: 3.8 g/dL (ref 3.8–4.9)
Alkaline Phosphatase: 68 IU/L (ref 49–135)
BUN/Creatinine Ratio: 19 (ref 9–23)
BUN: 13 mg/dL (ref 6–24)
Bilirubin Total: 0.4 mg/dL (ref 0.0–1.2)
CO2: 27 mmol/L (ref 20–29)
Calcium: 9.2 mg/dL (ref 8.7–10.2)
Chloride: 102 mmol/L (ref 96–106)
Creatinine, Ser: 0.67 mg/dL (ref 0.57–1.00)
Globulin, Total: 3.4 g/dL (ref 1.5–4.5)
Glucose: 183 mg/dL — ABNORMAL HIGH (ref 70–99)
Potassium: 3.7 mmol/L (ref 3.5–5.2)
Sodium: 142 mmol/L (ref 134–144)
Total Protein: 7.2 g/dL (ref 6.0–8.5)
eGFR: 101 mL/min/1.73

## 2024-06-23 LAB — LIPID PANEL
Chol/HDL Ratio: 3 ratio (ref 0.0–4.4)
Cholesterol, Total: 118 mg/dL (ref 100–199)
HDL: 40 mg/dL
LDL Chol Calc (NIH): 56 mg/dL (ref 0–99)
Triglycerides: 120 mg/dL (ref 0–149)
VLDL Cholesterol Cal: 22 mg/dL (ref 5–40)

## 2024-06-23 LAB — CBC
Hematocrit: 39.3 % (ref 34.0–46.6)
Hemoglobin: 13.2 g/dL (ref 11.1–15.9)
MCH: 30.4 pg (ref 26.6–33.0)
MCHC: 33.6 g/dL (ref 31.5–35.7)
MCV: 91 fL (ref 79–97)
Platelets: 223 x10E3/uL (ref 150–450)
RBC: 4.34 x10E6/uL (ref 3.77–5.28)
RDW: 12.8 % (ref 11.7–15.4)
WBC: 6 x10E3/uL (ref 3.4–10.8)

## 2024-06-23 LAB — TSH: TSH: 1.9 u[IU]/mL (ref 0.450–4.500)

## 2024-06-26 ENCOUNTER — Ambulatory Visit: Payer: Self-pay | Admitting: Nurse Practitioner

## 2024-07-10 ENCOUNTER — Telehealth: Payer: Self-pay

## 2024-07-10 ENCOUNTER — Telehealth: Payer: Self-pay | Admitting: *Deleted

## 2024-07-10 NOTE — Progress Notes (Signed)
 Complex Care Management Care Guide Note  07/10/2024 Name: Anna Stephens MRN: [id number] DOB: 1966/01/24  Anna Stephens is a 59 y.o. year old female who is a primary care patient of [name], NP and is actively engaged with the care management team. I reached out to [facility] by phone today to assist with re-scheduling  with the Pharmacist.  Follow up plan: Telephone appointment with complex care management team member scheduled for:  07/11/24  Harlene Satterfield  [facility]  [facility], [facility] Guide  Direct Dial: [phone]  Fax [phone]

## 2024-07-10 NOTE — Progress Notes (Signed)
 Complex Care Management Care Guide Note  07/10/2024 Name: Anna Stephens MRN: [id number] DOB: 09/11/1965  Anna Stephens is a 59 y.o. year old female who is a primary care patient of [name], NP and is actively engaged with the care management team. I reached out to [facility] by phone today to assist with re-scheduling  with the Pharmacist.  Follow up plan: Unsuccessful telephone outreach attempt made.   Harlene Satterfield  [facility]  Value-Based Care Institute, [facility] Guide  Direct Dial: [phone]  Fax [phone]

## 2024-07-10 NOTE — Telephone Encounter (Signed)
 Patient called with appointment reminder. Transportation assistance will be provided.  [name] Majesti Gambrell RN BSN PCCN  [facility] [phone]-cell [phone]-office

## 2024-07-11 ENCOUNTER — Other Ambulatory Visit: Payer: Self-pay

## 2024-07-11 NOTE — Congregational Nurse Program (Signed)
" °  Dept: [phone]   Congregational Nurse Program Note  Date of Encounter: 07/11/2024  Past Medical History: Past Medical History:  Diagnosis Date   Diabetes mellitus without complication (HCC)    Hypertension     Encounter Details:  Community Questionnaire - 07/11/24 1000       Questionnaire   Ask client: Do you give verbal consent for me to treat you today? Yes    Student Assistance Medical Student    Location Patient Served  [facility]    Encounter Setting Phone/Text/Email    Insurance Medicaid    Insurance/Financial Assistance Referral N/A    Medication Have Medication Insecurities    Medical Provider Yes    Medical Referrals Made NA    Medical Appointment Completed [facility]    Screenings CN Performed (remember to also record results) NA    CN Interventions Advocate/Support;Case Management    ED Visit Averted N/A          Transportation assistance provided for today`s appointment.  [name] Shayleigh Bouldin RN BSN PCCN  [facility] [phone]-cell [phone]-office   "

## 2024-07-11 NOTE — Progress Notes (Unsigned)
 "  07/11/2024 Name: Anna Stephens MRN: [id number] DOB: 11-06-1965  No chief complaint on file.   Anna Stephens is a 59 y.o. year old female who was referred for medication management by their primary care provider, Anna Anna RAMAN, NP. This appt was originally   They were referred to the pharmacist by their PCP for assistance in managing diabetes . PMH includes HTN, T2DM (hx of HHS in 2023), BMI > 30.   Subjective: Patient was last seen by PCP, Anna Oley, NP, on 01/03/24. At last visit, BP was 132/88 mmHg, HR 86. A1C had improved from 9.7% to 7.3%. At pharmacy appt in person on 03/21/24, noted adherence concerns based on fill hx. Assisted patient in requesting refills of maintenance medications, which had to be run through commercial plan since her Medicaid expired. Since this visit, it was confirmed that she is no longer eligible for Medicaid based on income.   Today, patient presents in good spirits without assistance. She brought her medication bottles and glucometer. Meds were filled 03/21/24 for 30 ds, but she still has ~7-15 pills remaining in each bottle indicating lack of adherence. She states she is taking Lantus  7 units daily and Trulicity  every Sunday, and states she just picked up a new box. She needs refill of Accu Chek test strips.   Care Team: Primary Care Provider: Oley Anna RAMAN, NP ; Next Scheduled Visit: needs to be scheduled   Medication Access/Adherence  Current Pharmacy:  [id number] - [place] - [address] [place] ([place]) [place] [address] Phone: [phone] Fax: [phone]   Patient reports affordability concerns with their medications: No  - confirms that $25 per mo for Trulicity  is affordable. Anticipate monthly cost of medications to be $39 unless she is picking up insulin  ($20 every 3 mo) which would make it $59 total.  Patient reports access/transportation concerns to their pharmacy: Yes  - but  she is able to get to [facility] easily to pick up medications. Was previously using [facility] pharmacy with help of Anna Stephens, engineer, water, but Anna Stephens reports patient does not consistently communicate when refills are needed so she would prefer us  to send her medications to [facility].  Patient reports adherence concerns with their medications:  Yes  - per fill hx and patient report. Reports she takes her medications around 11AM when leaving for work, but is sometimes rushing and forgets. Brought pill bottles with her today. States she has Trulicity  and Lantus  at home.  Diabetes:  Current medications: metformin  XR 500 mg BID (taking once daily), Trulicity  3 mg weekly (appears 1.5 mg dose was filled last month, takes on Sundays), Lantus  16 units daily (reports taking 7 units daily in the morning)  Denies n/v, abdominal pain, constipation with Trulicity .  Current glucose readings: Accu Chek Guide Meter - checking ~once daily 05/15/24: 137 mg/dL (3PM) 88/73/74: 716 mg/dL (3PM) 88/75/74: 823 mg/dL (8:69EF) 88/2/74: 693 mg/dL (7:69EF) 88/4/74: 671 mg/dL (8:59EF) 89/69/74: 732 mg/dL (7:69EF) 89/79/74: 783 mg/dL (2PM) 89/82/74: 867 mg/dL (87EF)   Patient denies hypoglycemic s/sx including dizziness, shakiness, sweating. Patient reports hyperglycemic symptoms including polyuria, polydipsia, nocturia. Denies neuropathy blurred vision.  Current meal patterns: Reports she does not get very hungry, typically eats twice per day. - sometimes eats fufu. Reports that she eats vegetables including spinach, green cassova. Reports eating protein like ground meat, beef.  - Drinks: Mainly drinks water, occasionally has soda (once/month)  Current physical activity: Reports that she walks a lot  Current  medication access support: commercial insurance   Hypertension:  Current medications: amlodipine  5 mg daily, losartan -hydrochlorothiazide  100-25 mg daily  Patient has an automated, upper arm home BP cuff -  states she has checked at home, but does not recall the numbers  Patient reports hypotensive s/sx including dizziness, lightheadedness occasionally at work.  Patient denies hypertensive symptoms including headache, chest pain, shortness of breath  Hyperlipidemia/ASCVD Risk Reduction  Current lipid lowering medications: atorvatsatin 20 mg daily  Objective:  BP Readings from Last 3 Encounters:  06/22/24 (!) 137/99  05/30/24 (!) 144/99  05/16/24 (!) 140/83    Lab Results  Component Value Date   HGBA1C 8.5 (A) 05/16/2024   HGBA1C 7.3 (A) 01/03/2024   HGBA1C 212 12/29/2023       Latest Ref Rng & Units 06/22/2024    9:12 AM 01/03/2024   11:15 AM 07/05/2023   11:16 AM  BMP  Glucose 70 - 99 mg/dL 816  743  690   BUN 6 - 24 mg/dL 13  10  11    Creatinine 0.57 - 1.00 mg/dL 9.32  9.28  9.42   BUN/Creat Ratio 9 - 23 19  14  19    Sodium 134 - 144 mmol/L 142  140  139   Potassium 3.5 - 5.2 mmol/L 3.7  3.9  4.2   Chloride 96 - 106 mmol/L 102  103  104   CO2 20 - 29 mmol/L 27  23  24    Calcium  8.7 - 10.2 mg/dL 9.2  8.9  8.5     Lab Results  Component Value Date   CHOL 118 06/22/2024   HDL 40 06/22/2024   LDLCALC 56 06/22/2024   TRIG 120 06/22/2024   CHOLHDL 3.0 06/22/2024    Medications Reviewed Today   Medications were not reviewed in this encounter       Assessment/Plan:   Diabetes: - Currently uncontrolled with A1C today of 8.5% above goal <7% and worsened from 7.3%, likely related to suboptimal medication adherence. Discussed methods for improving adherence including possibly switching dosing to evening when she gets home from work, and taking both tablets of metformin  at once. Will ask population health pharmacy technician for assistance in ensuring medications are filled at her preferred pharmacy.   - Last UACR April 2025 - 9 mg/g - Reviewed long term cardiovascular and renal outcomes of uncontrolled blood sugar - Reviewed goal A1c, goal fasting, and goal 2 hour post  prandial glucose - Reviewed hypoglycemia management plan and the rule of 15 - Reviewed dietary modifications including  utilizing the healthy plate method, limiting portion size of carbohydrate foods, increasing intake of protein and non-starchy vegetables. Counseled patient to stay hydrated with water throughout the day. - Reviewed lifestyle modifications including: aiming for 150 minutes of moderate intensity exercise every week.  - Recommend to increase Lantus  to 12 units daily - Recommend to take metformin  1000 mg once daily with a meal - Recommend to continue Trulicity  to 3 mg once weekly (appears 1.5 mg dose was filled last month, will ensure pharmacy fills 3 mg dose moving forward) - Recommend to check glucose twice daily: fasting and 2-hr PPG . Counseled patient to bring glucometer or BG log to every appointment. - Next A1C due March 2025    Hypertension: - Currently uncontrolled with clinic BP consistently above goal less than 130/80. Patient reports some dizziness at work, but BP has been elevated in clinic. Encouraged daily adherence and if the dizziness occurs more frequently with better adherence,  then we can consider down-titrating one of her medications. - Reviewed long term cardiovascular and renal outcomes of uncontrolled blood pressure - Recommend to continue amlodipine  5 mg daily, losartan -hydrochlorothiazide  100-25 mg daily     Hyperlipidemia/ASCVD Risk Reduction: - Currently uncontrolled with most recent LDL-C of 97 mg/dL above goal < 70 mg/dL given U7IF + comorbitidies. Moderate intensity statin indicated. Patient has bottle with her and is taking when she remembers. - Reviewed long term complications of uncontrolled cholesterol - Recommend to continue atorvastatin  20 mg daily   Written patient instructions provided. Patient verbalized understanding of treatment plan.   Follow Up Plan:  Pharmacist in person 07/12/23 PCP clinic visit 06/23/23   Lorain Baseman,  PharmD [facility] [phone]    "

## 2024-09-20 ENCOUNTER — Ambulatory Visit: Payer: Self-pay | Admitting: Nurse Practitioner
